# Patient Record
Sex: Male | Born: 1945
Health system: Southern US, Community
[De-identification: ages and names within clinical notes are randomized; demographics above are authoritative.]

## PROBLEM LIST (undated history)

## (undated) DIAGNOSIS — C4491 Basal cell carcinoma of skin, unspecified: Secondary | ICD-10-CM

## (undated) DIAGNOSIS — M354 Diffuse (eosinophilic) fasciitis: Secondary | ICD-10-CM

## (undated) DIAGNOSIS — M8580 Other specified disorders of bone density and structure, unspecified site: Secondary | ICD-10-CM

## (undated) DIAGNOSIS — L940 Localized scleroderma [morphea]: Secondary | ICD-10-CM

## (undated) DIAGNOSIS — D7218 Eosinophilia in diseases classified elsewhere: Secondary | ICD-10-CM

## (undated) DIAGNOSIS — K219 Gastro-esophageal reflux disease without esophagitis: Secondary | ICD-10-CM

## (undated) DIAGNOSIS — F5101 Primary insomnia: Secondary | ICD-10-CM

## (undated) HISTORY — PX: TOOTH EXTRACTION: SUR596

## (undated) HISTORY — DX: Primary insomnia: F51.01

## (undated) HISTORY — PX: COLONOSCOPY: SHX174

## (undated) HISTORY — DX: Eosinophilia in diseases classified elsewhere: D72.18

## (undated) HISTORY — PX: TONSILLECTOMY: SUR1361

## (undated) HISTORY — DX: Gastro-esophageal reflux disease without esophagitis: K21.9

## (undated) HISTORY — DX: Diffuse (eosinophilic) fasciitis: M35.4

## (undated) HISTORY — DX: Basal cell carcinoma of skin, unspecified: C44.91

## (undated) HISTORY — DX: Localized scleroderma (morphea): L94.0

## (undated) HISTORY — DX: Other specified disorders of bone density and structure, unspecified site: M85.80

---

## 2011-01-04 ENCOUNTER — Other Ambulatory Visit: Payer: Self-pay | Admitting: Family Medicine

## 2011-01-04 DIAGNOSIS — R05 Cough: Secondary | ICD-10-CM

## 2011-01-04 DIAGNOSIS — R9389 Abnormal findings on diagnostic imaging of other specified body structures: Secondary | ICD-10-CM

## 2011-01-10 ENCOUNTER — Ambulatory Visit
Admission: RE | Admit: 2011-01-10 | Discharge: 2011-01-10 | Disposition: A | Discharge status: Home / Self Care | Payer: 59 | Source: Ambulatory Visit | Attending: Family Medicine | Admitting: Family Medicine

## 2011-01-10 DIAGNOSIS — R05 Cough: Secondary | ICD-10-CM

## 2011-01-10 DIAGNOSIS — R9389 Abnormal findings on diagnostic imaging of other specified body structures: Secondary | ICD-10-CM

## 2013-11-18 ENCOUNTER — Other Ambulatory Visit: Payer: Self-pay | Admitting: Family Medicine

## 2013-11-18 DIAGNOSIS — R2242 Localized swelling, mass and lump, left lower limb: Secondary | ICD-10-CM

## 2013-11-18 DIAGNOSIS — Z87891 Personal history of nicotine dependence: Secondary | ICD-10-CM

## 2013-11-24 ENCOUNTER — Ambulatory Visit
Admission: RE | Admit: 2013-11-24 | Discharge: 2013-11-24 | Disposition: A | Discharge status: Home / Self Care | Payer: Commercial Managed Care - HMO | Source: Ambulatory Visit | Attending: Family Medicine | Admitting: Family Medicine

## 2013-11-24 ENCOUNTER — Other Ambulatory Visit: Payer: 59

## 2013-11-24 DIAGNOSIS — Z87891 Personal history of nicotine dependence: Secondary | ICD-10-CM

## 2013-11-24 DIAGNOSIS — R2242 Localized swelling, mass and lump, left lower limb: Secondary | ICD-10-CM

## 2014-03-22 DIAGNOSIS — B349 Viral infection, unspecified: Secondary | ICD-10-CM | POA: Diagnosis not present

## 2014-06-16 DIAGNOSIS — H5210 Myopia, unspecified eye: Secondary | ICD-10-CM | POA: Diagnosis not present

## 2014-07-29 DIAGNOSIS — H906 Mixed conductive and sensorineural hearing loss, bilateral: Secondary | ICD-10-CM | POA: Diagnosis not present

## 2014-07-29 DIAGNOSIS — H6123 Impacted cerumen, bilateral: Secondary | ICD-10-CM | POA: Diagnosis not present

## 2014-10-01 DIAGNOSIS — R609 Edema, unspecified: Secondary | ICD-10-CM | POA: Diagnosis not present

## 2014-10-01 DIAGNOSIS — R21 Rash and other nonspecific skin eruption: Secondary | ICD-10-CM | POA: Diagnosis not present

## 2014-10-01 DIAGNOSIS — R0989 Other specified symptoms and signs involving the circulatory and respiratory systems: Secondary | ICD-10-CM | POA: Diagnosis not present

## 2014-10-05 DIAGNOSIS — E538 Deficiency of other specified B group vitamins: Secondary | ICD-10-CM | POA: Diagnosis not present

## 2014-10-05 DIAGNOSIS — D649 Anemia, unspecified: Secondary | ICD-10-CM | POA: Diagnosis not present

## 2014-10-13 DIAGNOSIS — D649 Anemia, unspecified: Secondary | ICD-10-CM | POA: Diagnosis not present

## 2014-10-22 ENCOUNTER — Other Ambulatory Visit: Payer: Self-pay | Admitting: Family Medicine

## 2014-10-22 ENCOUNTER — Ambulatory Visit
Admission: RE | Admit: 2014-10-22 | Discharge: 2014-10-22 | Disposition: A | Discharge status: Home / Self Care | Payer: Commercial Managed Care - HMO | Source: Ambulatory Visit | Attending: Family Medicine | Admitting: Family Medicine

## 2014-10-22 DIAGNOSIS — R635 Abnormal weight gain: Secondary | ICD-10-CM | POA: Diagnosis not present

## 2014-10-22 DIAGNOSIS — R600 Localized edema: Secondary | ICD-10-CM

## 2014-10-22 MED ORDER — IOPAMIDOL (ISOVUE-300) INJECTION 61%: 100.0000 mL | Freq: Once | INTRAVENOUS | Status: DC | PRN

## 2014-10-29 DIAGNOSIS — R609 Edema, unspecified: Secondary | ICD-10-CM | POA: Diagnosis not present

## 2014-11-15 ENCOUNTER — Other Ambulatory Visit: Payer: Self-pay | Admitting: Family Medicine

## 2014-11-15 DIAGNOSIS — R609 Edema, unspecified: Secondary | ICD-10-CM

## 2014-11-18 ENCOUNTER — Inpatient Hospital Stay: Admission: RE | Admit: 2014-11-18 | Payer: Commercial Managed Care - HMO | Source: Ambulatory Visit

## 2014-11-19 ENCOUNTER — Emergency Department (HOSPITAL_BASED_OUTPATIENT_CLINIC_OR_DEPARTMENT_OTHER): Payer: Commercial Managed Care - HMO

## 2014-11-19 ENCOUNTER — Encounter (HOSPITAL_BASED_OUTPATIENT_CLINIC_OR_DEPARTMENT_OTHER): Payer: Self-pay | Admitting: *Deleted

## 2014-11-19 ENCOUNTER — Emergency Department (HOSPITAL_BASED_OUTPATIENT_CLINIC_OR_DEPARTMENT_OTHER)
Admission: EM | Admit: 2014-11-19 | Discharge: 2014-11-19 | Disposition: A | Discharge status: Home / Self Care | Payer: Commercial Managed Care - HMO | Attending: Emergency Medicine | Admitting: Emergency Medicine

## 2014-11-19 DIAGNOSIS — R609 Edema, unspecified: Secondary | ICD-10-CM | POA: Diagnosis not present

## 2014-11-19 DIAGNOSIS — Z23 Encounter for immunization: Secondary | ICD-10-CM | POA: Diagnosis not present

## 2014-11-19 DIAGNOSIS — D649 Anemia, unspecified: Secondary | ICD-10-CM | POA: Diagnosis not present

## 2014-11-19 DIAGNOSIS — R600 Localized edema: Secondary | ICD-10-CM | POA: Diagnosis not present

## 2014-11-19 DIAGNOSIS — Z0001 Encounter for general adult medical examination with abnormal findings: Secondary | ICD-10-CM | POA: Diagnosis not present

## 2014-11-19 DIAGNOSIS — R7989 Other specified abnormal findings of blood chemistry: Secondary | ICD-10-CM | POA: Diagnosis not present

## 2014-11-19 DIAGNOSIS — E538 Deficiency of other specified B group vitamins: Secondary | ICD-10-CM | POA: Diagnosis not present

## 2014-11-19 DIAGNOSIS — Z7189 Other specified counseling: Secondary | ICD-10-CM | POA: Diagnosis not present

## 2014-11-19 LAB — BASIC METABOLIC PANEL
Anion gap: 6 (ref 5–15)
BUN: 14 mg/dL (ref 6–20)
CHLORIDE: 101 mmol/L (ref 101–111)
CO2: 29 mmol/L (ref 22–32)
CREATININE: 0.95 mg/dL (ref 0.61–1.24)
Calcium: 8.6 mg/dL — ABNORMAL LOW (ref 8.9–10.3)
GFR calc non Af Amer: >60 mL/min (ref >60)
Glucose, Bld: 104 mg/dL — ABNORMAL HIGH (ref 65–99)
Potassium: 3.8 mmol/L (ref 3.5–5.1)
SODIUM: 136 mmol/L (ref 135–145)

## 2014-11-19 LAB — CBC WITH DIFFERENTIAL/PLATELET
BASOS ABS: 0 10*3/uL (ref 0.0–0.1)
Basophils Relative: 0 %
Eosinophils Absolute: 1.9 10*3/uL — ABNORMAL HIGH (ref 0.0–0.7)
Eosinophils Relative: 22 %
HCT: 30.1 % — ABNORMAL LOW (ref 39.0–52.0)
HEMOGLOBIN: 10.2 g/dL — AB (ref 13.0–17.0)
Lymphocytes Relative: 7 %
Lymphs Abs: 0.6 10*3/uL — ABNORMAL LOW (ref 0.7–4.0)
MCH: 30.6 pg (ref 26.0–34.0)
MCHC: 33.9 g/dL (ref 30.0–36.0)
MCV: 90.4 fL (ref 78.0–100.0)
MONOS PCT: 5 %
Monocytes Absolute: 0.4 10*3/uL (ref 0.1–1.0)
NEUTROS PCT: 66 %
Neutro Abs: 5.9 10*3/uL (ref 1.7–7.7)
Platelets: 325 10*3/uL (ref 150–400)
RBC: 3.33 MIL/uL — AB (ref 4.22–5.81)
RDW: 13.3 % (ref 11.5–15.5)
WBC: 8.8 10*3/uL (ref 4.0–10.5)

## 2014-11-19 MED ORDER — IOHEXOL 350 MG/ML SOLN
100.0000 mL | Freq: Once | INTRAVENOUS | Status: AC | PRN
  Administered 2014-11-19: 100 mL via INTRAVENOUS

## 2014-11-19 NOTE — Discharge Instructions (Signed)
Edema °Edema is an abnormal buildup of fluids in your body tissues. Edema is somewhat dependent on gravity to pull the fluid to the lowest place in your body. That makes the condition more common in the legs and thighs (lower extremities). Painless swelling of the feet and ankles is common and becomes more likely as you get older. It is also common in looser tissues, like around your eyes.  °When the affected area is squeezed, the fluid may move out of that spot and leave a dent for a few moments. This dent is called pitting.  °CAUSES  °There are many possible causes of edema. Eating too much salt and being on your feet or sitting for a long time can cause edema in your legs and ankles. Hot weather may make edema worse. Common medical causes of edema include: °· Heart failure. °· Liver disease. °· Kidney disease. °· Weak blood vessels in your legs. °· Cancer. °· An injury. °· Pregnancy. °· Some medications. °· Obesity.  °SYMPTOMS  °Edema is usually painless. Your skin may look swollen or shiny.  °DIAGNOSIS  °Your health care provider may be able to diagnose edema by asking about your medical history and doing a physical exam. You may need to have tests such as X-rays, an electrocardiogram, or blood tests to check for medical conditions that may cause edema.  °TREATMENT  °Edema treatment depends on the cause. If you have heart, liver, or kidney disease, you need the treatment appropriate for these conditions. General treatment may include: °· Elevation of the affected body part above the level of your heart. °· Compression of the affected body part. Pressure from elastic bandages or support stockings squeezes the tissues and forces fluid back into the blood vessels. This keeps fluid from entering the tissues. °· Restriction of fluid and salt intake. °· Use of a water pill (diuretic). These medications are appropriate only for some types of edema. They pull fluid out of your body and make you urinate more often. This  gets rid of fluid and reduces swelling, but diuretics can have side effects. Only use diuretics as directed by your health care provider. °HOME CARE INSTRUCTIONS  °· Keep the affected body part above the level of your heart when you are lying down.   °· Do not sit still or stand for prolonged periods.   °· Do not put anything directly under your knees when lying down. °· Do not wear constricting clothing or garters on your upper legs.   °· Exercise your legs to work the fluid back into your blood vessels. This may help the swelling go down.   °· Wear elastic bandages or support stockings to reduce ankle swelling as directed by your health care provider.   °· Eat a low-salt diet to reduce fluid if your health care provider recommends it.   °· Only take medicines as directed by your health care provider.  °SEEK MEDICAL CARE IF:  °· Your edema is not responding to treatment. °· You have heart, liver, or kidney disease and notice symptoms of edema. °· You have edema in your legs that does not improve after elevating them.   °· You have sudden and unexplained weight gain. °SEEK IMMEDIATE MEDICAL CARE IF:  °· You develop shortness of breath or chest pain.   °· You cannot breathe when you lie down. °· You develop pain, redness, or warmth in the swollen areas.   °· You have heart, liver, or kidney disease and suddenly get edema. °· You have a fever and your symptoms suddenly get worse. °MAKE SURE YOU:  °·   Understand these instructions. °· Will watch your condition. °· Will get help right away if you are not doing well or get worse. °  °This information is not intended to replace advice given to you by your health care provider. Make sure you discuss any questions you have with your health care provider. °  °Document Released: 01/01/2005 Document Revised: 01/22/2014 Document Reviewed: 10/24/2012 °Elsevier Interactive Patient Education ©2016 [employer] ° °

## 2014-11-19 NOTE — ED Notes (Signed)
Patient denies any SOB, or Chest pain. Gross pitting edema noted to his bilateral lower extremities and upper extremities from his elbow down. Skin is tight and reddish in color with dry flaky skin noted to his lower extremities. Patient ambulatory without any distress. No Respiratory distress noted with exertion, per the patient. No observed.

## 2014-11-19 NOTE — ED Notes (Signed)
Patient transported to CT 

## 2014-11-19 NOTE — ED Provider Notes (Signed)
CSN: [id number]     Arrival date & time 11/19/14  1629 History   First MD Initiated Contact with Patient 11/19/14 1653     Chief Complaint  Patient presents with  . Leg Swelling  . Abnormal Lab    HPI Pt has been having trouble with swelling in his extremities primarily in his arms for a few months.  He also has noticed a 5 lb weight gain.  His arms have felt swollen and tight.  It then started involving his legs.  His skin has been scaly.  He has been seeing his primary doctor for this.  He has had extensive testing.  He had  CT scan of his abdomen.  He has had lab testing as well.  His doctor recently did a d dimer test and it was elevated.  He was sent to the ED today to have a CT scan of his chest. History reviewed. No pertinent past medical history. Past Surgical History  Procedure Laterality Date  . Tonsillectomy     No family history on file. Social History  Substance Use Topics  . Smoking status: Never Smoker   . Smokeless tobacco: None  . Alcohol Use: Yes     Comment: daily    Review of Systems  Respiratory: Negative for choking, chest tightness and shortness of breath.   Cardiovascular: Negative for chest pain.  All other systems reviewed and are negative.     Allergies  Meloxicam  Home Medications   Prior to Admission medications   Not on File   BP 124/68 mmHg  Pulse 90  Temp(Src) 98.9 F (37.2 C) (Oral)  Resp 20  Ht 5\' 9"  (1.753 m)  Wt 168 lb (76.204 kg)  BMI 24.80 kg/m2  SpO2 100% Physical Exam  Musculoskeletal: He exhibits edema.  Bilateral upper and lower extremities    ED Course  Procedures (including critical care time) Labs Review Labs Reviewed  CBC WITH DIFFERENTIAL/PLATELET - Abnormal; Notable for the following:    RBC 3.33 (*)    Hemoglobin 10.2 (*)    HCT 30.1 (*)    Lymphs Abs 0.6 (*)    Eosinophils Absolute 1.9 (*)    All other components within normal limits  BASIC METABOLIC PANEL - Abnormal; Notable for the following:     Glucose, Bld 104 (*)    Calcium 8.6 (*)    All other components within normal limits    Imaging Review Ct Angio Chest Pe W/cm &/or Wo Cm  11/19/2014  CLINICAL DATA:  69 year old with bilateral upper and lower extremity edema. Elevated D-dimer at his primary care provider's office earlier today. EXAM: CT ANGIOGRAPHY CHEST WITH CONTRAST TECHNIQUE: Multidetector CT imaging of the chest was performed using the standard protocol during bolus administration of intravenous contrast. Multiplanar CT image reconstructions and MIPs were obtained to evaluate the vascular anatomy. CONTRAST:  16mL OMNIPAQUE IOHEXOL 350 MG/ML SOLN COMPARISON:  No prior CTA chest.  Unenhanced CT chest 01/10/2011. FINDINGS: Technical quality:  Good. Pulmonary embolism:  Absent. Cardiovascular: Normal heart size. No pericardial effusion. Minimal LAD coronary atherosclerosis. Mild atherosclerosis involving the thoracic and upper abdominal aorta without aneurysm. Mediastinum/Lymph Nodes: No pathologically enlarged mediastinal, hilar or axillary lymph nodes. No mediastinal masses. Normal-appearing esophagus. Thyroid gland normal in appearance. Lungs/Pleura: Hyperinflation with emphysematous changes throughout both lungs. Biapical pleuroparenchymal scarring, calcified on the left. Minimal linear scarring in the lower lobes. Pulmonary parenchyma clear otherwise without localized airspace consolidation, interstitial disease, or parenchymal nodules or masses. Central airways patent  without significant bronchial wall thickening. No pleural effusions. No pleural plaques or masses. Upper abdomen: Possible splenic enlargement. Visualized upper abdomen otherwise unremarkable for the early portal venous phase of enhancement. 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 Review of the MIP images confirms the above findings. IMPRESSION: 1. No evidence of pulmonary embolism. 2. COPD/emphysema.  No acute  cardiopulmonary disease. 3. Minimal LAD coronary atherosclerosis. 4. Possible splenomegaly. Electronically Signed   By: Evangeline Dakin M.D.   On: 11/19/2014 18:33   US Venous Img Lower Bilateral  11/19/2014  CLINICAL DATA:  Bilateral leg swelling for the past 2 months. Elevated D-dimer. EXAM: BILATERAL LOWER EXTREMITY VENOUS DOPPLER ULTRASOUND TECHNIQUE: Gray-scale sonography with graded compression, as well as color Doppler and duplex ultrasound were performed to evaluate the lower extremity deep venous systems from the level of the common femoral vein and including the common femoral, femoral, profunda femoral, popliteal and calf veins including the posterior tibial, peroneal and gastrocnemius veins when visible. The superficial great saphenous vein was also interrogated. Spectral Doppler was utilized to evaluate flow at rest and with distal augmentation maneuvers in the common femoral, femoral and popliteal veins. COMPARISON:  None. FINDINGS: RIGHT LOWER EXTREMITY Common Femoral Vein: No evidence of thrombus. Normal compressibility, respiratory phasicity and response to augmentation. Saphenofemoral Junction: No evidence of thrombus. Normal compressibility and flow on color Doppler imaging. Profunda Femoral Vein: No evidence of thrombus. Normal compressibility and flow on color Doppler imaging. Femoral Vein: No evidence of thrombus. Normal compressibility, respiratory phasicity and response to augmentation. Popliteal Vein: No evidence of thrombus. Normal compressibility, respiratory phasicity and response to augmentation. Calf Veins: No evidence of thrombus. Normal compressibility and flow on color Doppler imaging. Superficial Great Saphenous Vein: No evidence of thrombus. Normal compressibility and flow on color Doppler imaging. Venous Reflux:  None. Other Findings:  None. LEFT LOWER EXTREMITY Common Femoral Vein: No evidence of thrombus. Normal compressibility, respiratory phasicity and response to  augmentation. Saphenofemoral Junction: No evidence of thrombus. Normal compressibility and flow on color Doppler imaging. Profunda Femoral Vein: No evidence of thrombus. Normal compressibility and flow on color Doppler imaging. Femoral Vein: No evidence of thrombus. Normal compressibility, respiratory phasicity and response to augmentation. Popliteal Vein: No evidence of thrombus. Normal compressibility, respiratory phasicity and response to augmentation. Calf Veins: No evidence of thrombus. Normal compressibility and flow on color Doppler imaging. Superficial Great Saphenous Vein: No evidence of thrombus. Normal compressibility and flow on color Doppler imaging. Venous Reflux:  None. Other Findings: Bilateral diffuse subcutaneous edema. There is also a left popliteal cyst measuring 5.1 x 4.4 x 1.8 cm. Pulsatile deep venous waveforms bilaterally. IMPRESSION: 1. No deep venous thrombosis seen. 2. 5.1 cm left popliteal cyst. 3. Diffuse subcutaneous edema. 4. Pulsatile deep venous waveforms bilaterally, compatible with right heart failure. Electronically Signed   By: Claudie Revering M.D.   On: 11/19/2014 18:46   I have personally reviewed and evaluated these images and lab results as part of my medical decision-making.  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 Patient has no evidence of pulmonary embolism or venous thrombosis. He does have a mild anemia but this is not new.  Doppler study didn't suggest the possibility of right sided heart failure. This could account for the peripheral edema. I reviewed the office notes from the  patient's primary care doctor. Elpidio Eric for an outpatient echocardiogram and cardiology consultation. Patient is not having any chest pain or dyspnea. Do think he is stable to continue this outpatient  evaluation.    Dorie Rank, MD 11/19/14 2020

## 2014-11-19 NOTE — ED Notes (Signed)
He has had swelling in his extremities for a couple of weeks. He went for a physical this am and had an elevated Ddimer. Denies sob or pain.

## 2014-11-29 ENCOUNTER — Other Ambulatory Visit: Payer: Self-pay | Admitting: Cardiology

## 2014-11-29 ENCOUNTER — Ambulatory Visit
Admission: RE | Admit: 2014-11-29 | Discharge: 2014-11-29 | Disposition: A | Discharge status: Home / Self Care | Payer: Commercial Managed Care - HMO | Source: Ambulatory Visit | Attending: Cardiology | Admitting: Cardiology

## 2014-11-29 DIAGNOSIS — R0602 Shortness of breath: Secondary | ICD-10-CM | POA: Diagnosis not present

## 2014-11-29 DIAGNOSIS — R609 Edema, unspecified: Secondary | ICD-10-CM | POA: Diagnosis not present

## 2014-11-29 DIAGNOSIS — D649 Anemia, unspecified: Secondary | ICD-10-CM | POA: Diagnosis not present

## 2014-11-29 DIAGNOSIS — D508 Other iron deficiency anemias: Secondary | ICD-10-CM | POA: Diagnosis not present

## 2014-11-29 DIAGNOSIS — R600 Localized edema: Secondary | ICD-10-CM | POA: Diagnosis not present

## 2014-12-21 DIAGNOSIS — L940 Localized scleroderma [morphea]: Secondary | ICD-10-CM | POA: Diagnosis not present

## 2014-12-21 DIAGNOSIS — L986 Other infiltrative disorders of the skin and subcutaneous tissue: Secondary | ICD-10-CM | POA: Diagnosis not present

## 2014-12-21 DIAGNOSIS — Z79899 Other long term (current) drug therapy: Secondary | ICD-10-CM | POA: Diagnosis not present

## 2014-12-21 DIAGNOSIS — M349 Systemic sclerosis, unspecified: Secondary | ICD-10-CM | POA: Diagnosis not present

## 2014-12-24 DIAGNOSIS — L986 Other infiltrative disorders of the skin and subcutaneous tissue: Secondary | ICD-10-CM | POA: Diagnosis not present

## 2014-12-24 DIAGNOSIS — L940 Localized scleroderma [morphea]: Secondary | ICD-10-CM | POA: Diagnosis not present

## 2014-12-24 DIAGNOSIS — Z79899 Other long term (current) drug therapy: Secondary | ICD-10-CM | POA: Diagnosis not present

## 2014-12-24 DIAGNOSIS — M349 Systemic sclerosis, unspecified: Secondary | ICD-10-CM | POA: Diagnosis not present

## 2014-12-28 DIAGNOSIS — R5383 Other fatigue: Secondary | ICD-10-CM | POA: Diagnosis not present

## 2014-12-28 DIAGNOSIS — R21 Rash and other nonspecific skin eruption: Secondary | ICD-10-CM | POA: Diagnosis not present

## 2014-12-28 DIAGNOSIS — L940 Localized scleroderma [morphea]: Secondary | ICD-10-CM | POA: Diagnosis not present

## 2014-12-28 DIAGNOSIS — M354 Diffuse (eosinophilic) fasciitis: Secondary | ICD-10-CM | POA: Diagnosis not present

## 2015-01-03 DIAGNOSIS — M354 Diffuse (eosinophilic) fasciitis: Secondary | ICD-10-CM | POA: Diagnosis not present

## 2015-01-12 ENCOUNTER — Other Ambulatory Visit: Payer: Self-pay | Admitting: Dermatology

## 2015-01-18 DIAGNOSIS — M354 Diffuse (eosinophilic) fasciitis: Secondary | ICD-10-CM | POA: Diagnosis not present

## 2015-01-18 DIAGNOSIS — F5101 Primary insomnia: Secondary | ICD-10-CM | POA: Diagnosis not present

## 2015-01-18 DIAGNOSIS — Z79899 Other long term (current) drug therapy: Secondary | ICD-10-CM | POA: Diagnosis not present

## 2015-01-19 ENCOUNTER — Encounter: Payer: Self-pay | Admitting: Cardiology

## 2015-01-19 DIAGNOSIS — M354 Diffuse (eosinophilic) fasciitis: Principal | ICD-10-CM

## 2015-01-25 DIAGNOSIS — Z79899 Other long term (current) drug therapy: Secondary | ICD-10-CM | POA: Diagnosis not present

## 2015-01-25 DIAGNOSIS — M354 Diffuse (eosinophilic) fasciitis: Secondary | ICD-10-CM | POA: Diagnosis not present

## 2015-02-09 DIAGNOSIS — M354 Diffuse (eosinophilic) fasciitis: Secondary | ICD-10-CM | POA: Diagnosis not present

## 2015-02-14 DIAGNOSIS — L2084 Intrinsic (allergic) eczema: Secondary | ICD-10-CM | POA: Diagnosis not present

## 2015-02-14 DIAGNOSIS — Z5181 Encounter for therapeutic drug level monitoring: Secondary | ICD-10-CM | POA: Diagnosis not present

## 2015-02-14 DIAGNOSIS — M354 Diffuse (eosinophilic) fasciitis: Secondary | ICD-10-CM | POA: Diagnosis not present

## 2015-02-25 DIAGNOSIS — G4700 Insomnia, unspecified: Secondary | ICD-10-CM | POA: Diagnosis not present

## 2015-02-25 DIAGNOSIS — R21 Rash and other nonspecific skin eruption: Secondary | ICD-10-CM | POA: Diagnosis not present

## 2015-02-25 DIAGNOSIS — Z6821 Body mass index (BMI) 21.0-21.9, adult: Secondary | ICD-10-CM | POA: Diagnosis not present

## 2015-04-12 DIAGNOSIS — Z5181 Encounter for therapeutic drug level monitoring: Secondary | ICD-10-CM | POA: Diagnosis not present

## 2015-04-12 DIAGNOSIS — L309 Dermatitis, unspecified: Secondary | ICD-10-CM | POA: Diagnosis not present

## 2015-04-12 DIAGNOSIS — M354 Diffuse (eosinophilic) fasciitis: Secondary | ICD-10-CM | POA: Diagnosis not present

## 2015-05-25 DIAGNOSIS — Z7952 Long term (current) use of systemic steroids: Secondary | ICD-10-CM | POA: Diagnosis not present

## 2015-05-25 DIAGNOSIS — Z79899 Other long term (current) drug therapy: Secondary | ICD-10-CM | POA: Diagnosis not present

## 2015-05-25 DIAGNOSIS — L97909 Non-pressure chronic ulcer of unspecified part of unspecified lower leg with unspecified severity: Secondary | ICD-10-CM | POA: Diagnosis not present

## 2015-05-25 DIAGNOSIS — M354 Diffuse (eosinophilic) fasciitis: Secondary | ICD-10-CM | POA: Diagnosis not present

## 2015-06-07 DIAGNOSIS — M354 Diffuse (eosinophilic) fasciitis: Secondary | ICD-10-CM | POA: Diagnosis not present

## 2015-06-14 DIAGNOSIS — M354 Diffuse (eosinophilic) fasciitis: Secondary | ICD-10-CM | POA: Diagnosis not present

## 2015-08-03 DIAGNOSIS — I890 Lymphedema, not elsewhere classified: Secondary | ICD-10-CM | POA: Diagnosis not present

## 2015-08-03 DIAGNOSIS — Z79899 Other long term (current) drug therapy: Secondary | ICD-10-CM | POA: Diagnosis not present

## 2015-08-03 DIAGNOSIS — L97922 Non-pressure chronic ulcer of unspecified part of left lower leg with fat layer exposed: Secondary | ICD-10-CM | POA: Diagnosis not present

## 2015-08-03 DIAGNOSIS — M354 Diffuse (eosinophilic) fasciitis: Secondary | ICD-10-CM | POA: Diagnosis not present

## 2015-08-03 DIAGNOSIS — L97912 Non-pressure chronic ulcer of unspecified part of right lower leg with fat layer exposed: Secondary | ICD-10-CM | POA: Diagnosis not present

## 2015-11-01 DIAGNOSIS — Z23 Encounter for immunization: Secondary | ICD-10-CM | POA: Diagnosis not present

## 2015-11-07 DIAGNOSIS — M354 Diffuse (eosinophilic) fasciitis: Secondary | ICD-10-CM | POA: Diagnosis not present

## 2015-11-07 DIAGNOSIS — Z7952 Long term (current) use of systemic steroids: Secondary | ICD-10-CM | POA: Diagnosis not present

## 2015-11-07 DIAGNOSIS — Z79899 Other long term (current) drug therapy: Secondary | ICD-10-CM | POA: Diagnosis not present

## 2015-11-12 DIAGNOSIS — L0291 Cutaneous abscess, unspecified: Secondary | ICD-10-CM | POA: Diagnosis not present

## 2015-11-12 DIAGNOSIS — L02511 Cutaneous abscess of right hand: Secondary | ICD-10-CM | POA: Diagnosis not present

## 2015-11-17 DIAGNOSIS — L03019 Cellulitis of unspecified finger: Secondary | ICD-10-CM | POA: Diagnosis not present

## 2015-12-06 DIAGNOSIS — F5101 Primary insomnia: Secondary | ICD-10-CM | POA: Diagnosis not present

## 2015-12-06 DIAGNOSIS — Z0000 Encounter for general adult medical examination without abnormal findings: Secondary | ICD-10-CM | POA: Diagnosis not present

## 2015-12-13 DIAGNOSIS — R5383 Other fatigue: Secondary | ICD-10-CM | POA: Diagnosis not present

## 2015-12-13 DIAGNOSIS — M354 Diffuse (eosinophilic) fasciitis: Secondary | ICD-10-CM | POA: Diagnosis not present

## 2015-12-13 DIAGNOSIS — R21 Rash and other nonspecific skin eruption: Secondary | ICD-10-CM | POA: Diagnosis not present

## 2016-03-14 DIAGNOSIS — L97912 Non-pressure chronic ulcer of unspecified part of right lower leg with fat layer exposed: Secondary | ICD-10-CM | POA: Diagnosis not present

## 2016-03-14 DIAGNOSIS — M354 Diffuse (eosinophilic) fasciitis: Secondary | ICD-10-CM | POA: Diagnosis not present

## 2016-03-14 DIAGNOSIS — Z79899 Other long term (current) drug therapy: Secondary | ICD-10-CM | POA: Diagnosis not present

## 2016-03-15 DIAGNOSIS — L97912 Non-pressure chronic ulcer of unspecified part of right lower leg with fat layer exposed: Secondary | ICD-10-CM | POA: Insufficient documentation

## 2016-04-24 DIAGNOSIS — X32XXXD Exposure to sunlight, subsequent encounter: Secondary | ICD-10-CM | POA: Diagnosis not present

## 2016-04-24 DIAGNOSIS — L821 Other seborrheic keratosis: Secondary | ICD-10-CM | POA: Diagnosis not present

## 2016-04-24 DIAGNOSIS — M354 Diffuse (eosinophilic) fasciitis: Secondary | ICD-10-CM | POA: Diagnosis not present

## 2016-04-24 DIAGNOSIS — L570 Actinic keratosis: Secondary | ICD-10-CM | POA: Diagnosis not present

## 2016-07-25 DIAGNOSIS — L97912 Non-pressure chronic ulcer of unspecified part of right lower leg with fat layer exposed: Secondary | ICD-10-CM | POA: Diagnosis not present

## 2016-07-25 DIAGNOSIS — Z872 Personal history of diseases of the skin and subcutaneous tissue: Secondary | ICD-10-CM | POA: Diagnosis not present

## 2016-07-25 DIAGNOSIS — Z7952 Long term (current) use of systemic steroids: Secondary | ICD-10-CM | POA: Diagnosis not present

## 2016-07-25 DIAGNOSIS — M354 Diffuse (eosinophilic) fasciitis: Secondary | ICD-10-CM | POA: Diagnosis not present

## 2016-07-25 DIAGNOSIS — Z79899 Other long term (current) drug therapy: Secondary | ICD-10-CM | POA: Diagnosis not present

## 2016-12-05 DIAGNOSIS — Z79899 Other long term (current) drug therapy: Secondary | ICD-10-CM | POA: Diagnosis not present

## 2016-12-05 DIAGNOSIS — M354 Diffuse (eosinophilic) fasciitis: Secondary | ICD-10-CM | POA: Diagnosis not present

## 2016-12-05 DIAGNOSIS — Z5181 Encounter for therapeutic drug level monitoring: Secondary | ICD-10-CM | POA: Diagnosis not present

## 2016-12-11 DIAGNOSIS — R5383 Other fatigue: Secondary | ICD-10-CM | POA: Diagnosis not present

## 2016-12-11 DIAGNOSIS — Z131 Encounter for screening for diabetes mellitus: Secondary | ICD-10-CM | POA: Diagnosis not present

## 2016-12-11 DIAGNOSIS — M354 Diffuse (eosinophilic) fasciitis: Secondary | ICD-10-CM | POA: Diagnosis not present

## 2016-12-11 DIAGNOSIS — Z13220 Encounter for screening for lipoid disorders: Secondary | ICD-10-CM | POA: Diagnosis not present

## 2016-12-11 DIAGNOSIS — Z125 Encounter for screening for malignant neoplasm of prostate: Secondary | ICD-10-CM | POA: Diagnosis not present

## 2016-12-18 DIAGNOSIS — Z0000 Encounter for general adult medical examination without abnormal findings: Secondary | ICD-10-CM | POA: Diagnosis not present

## 2016-12-18 DIAGNOSIS — M354 Diffuse (eosinophilic) fasciitis: Secondary | ICD-10-CM | POA: Diagnosis not present

## 2016-12-18 DIAGNOSIS — Z125 Encounter for screening for malignant neoplasm of prostate: Secondary | ICD-10-CM | POA: Diagnosis not present

## 2016-12-18 DIAGNOSIS — R21 Rash and other nonspecific skin eruption: Secondary | ICD-10-CM | POA: Diagnosis not present

## 2016-12-18 DIAGNOSIS — R5383 Other fatigue: Secondary | ICD-10-CM | POA: Diagnosis not present

## 2016-12-21 IMAGING — CT CT ANGIO CHEST
2 of 6 series · 18 of 36 positions shown · IV contrast (APPLIED)
Comparison: No prior CTA chest.  Unenhanced CT chest 01/10/2011.

CLINICAL DATA: 68-year-old with bilateral upper and lower extremity
edema. Elevated D-dimer at his primary care provider's office
earlier today.

EXAM:
CT ANGIOGRAPHY CHEST WITH CONTRAST
TECHNIQUE: Multidetector CT imaging of the chest was performed using the
standard protocol during bolus administration of intravenous
contrast. Multiplanar CT image reconstructions and MIPs were
obtained to evaluate the vascular anatomy.
CONTRAST:  100mL OMNIPAQUE IOHEXOL 350 MG/ML SOLN

[Series 5: pe 1.0 b26f · axial · 0.71mm/px · z∈[-354,-45]mm · 17 of 345 slices shown]
[im 18/345  lung]
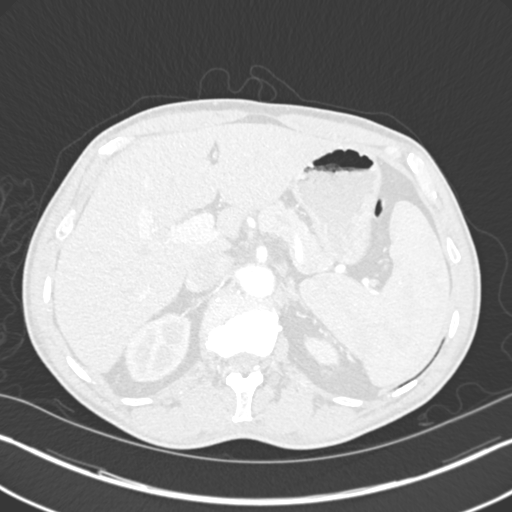
[im 35/345  mediastinal]
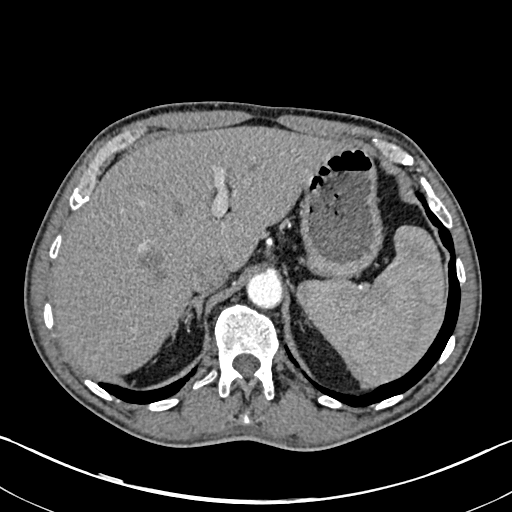
[im 52/345  lung]
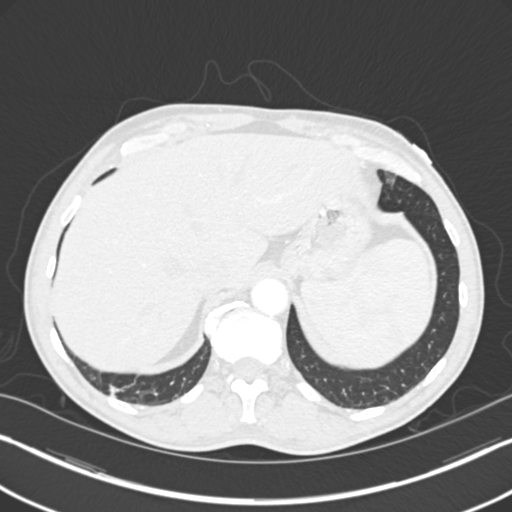
[im 69/345  mediastinal]
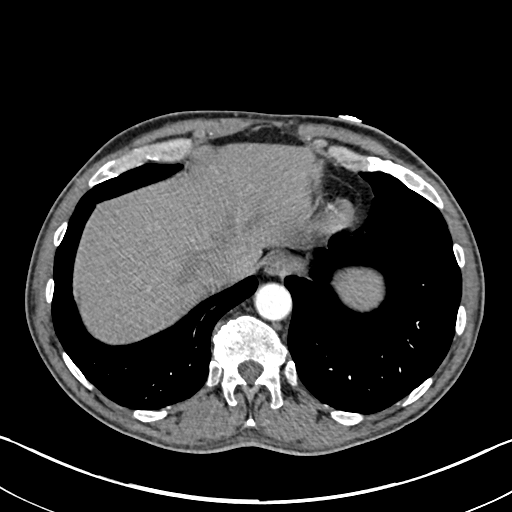
[im 104/345  lung]
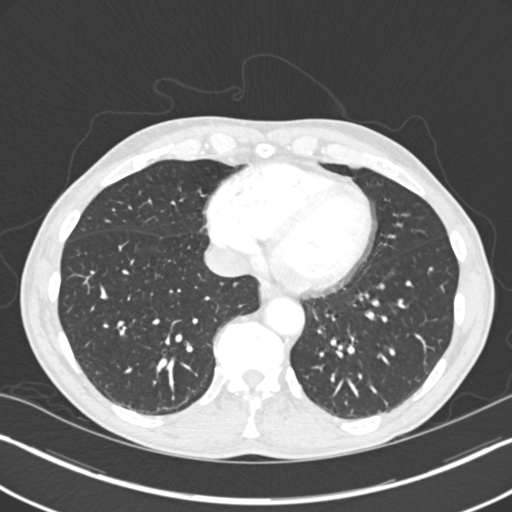
[im 121/345  mediastinal]
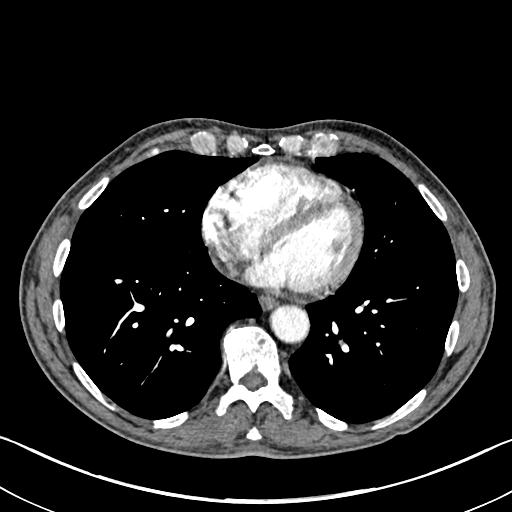
[im 138/345  lung]
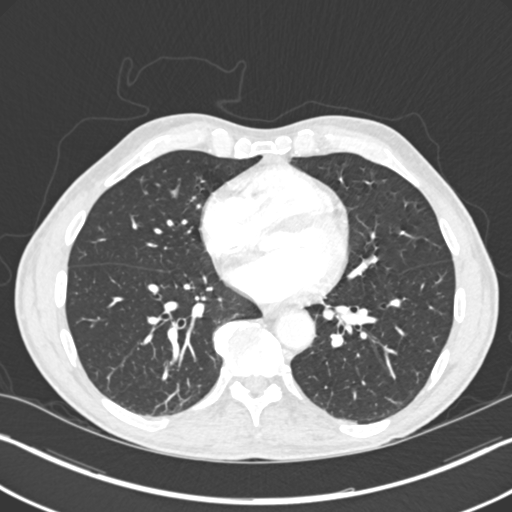
[im 155/345  mediastinal]
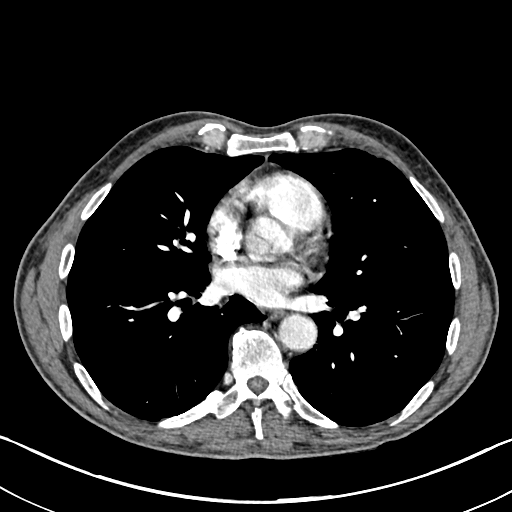
[im 173/345  lung]
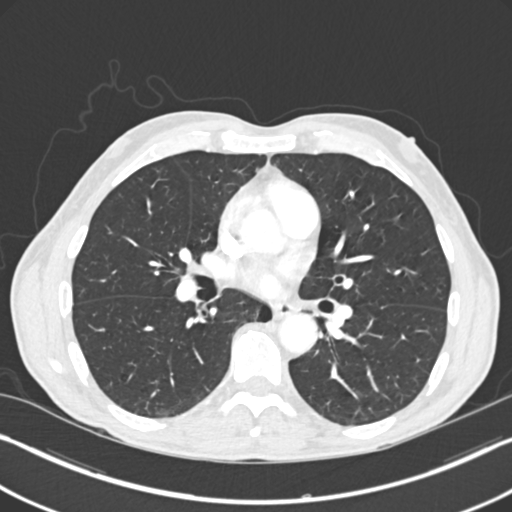
[im 190/345  mediastinal]
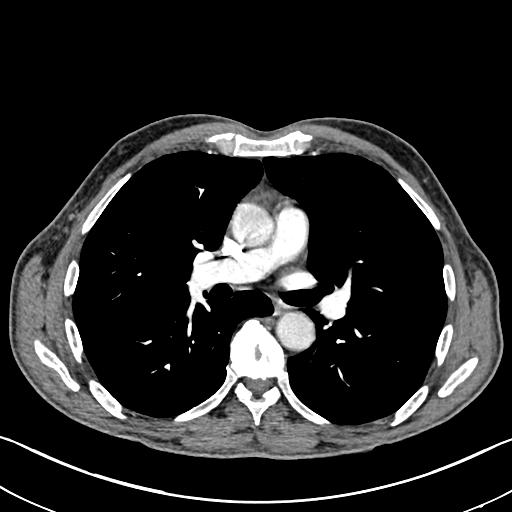
[im 207/345  lung]
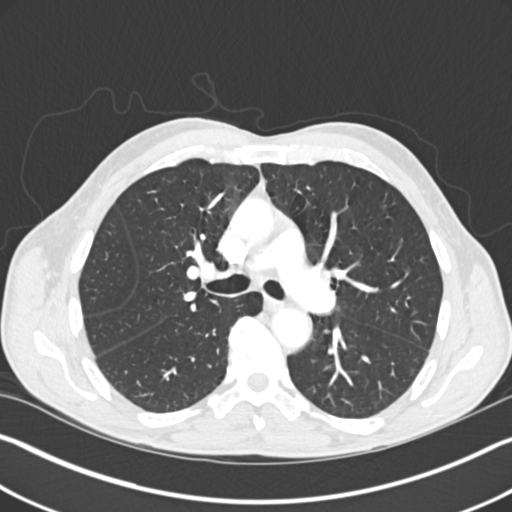
[im 224/345  mediastinal]
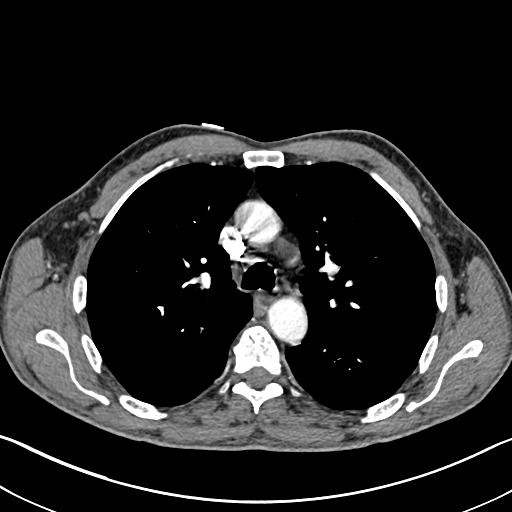
[im 241/345  lung]
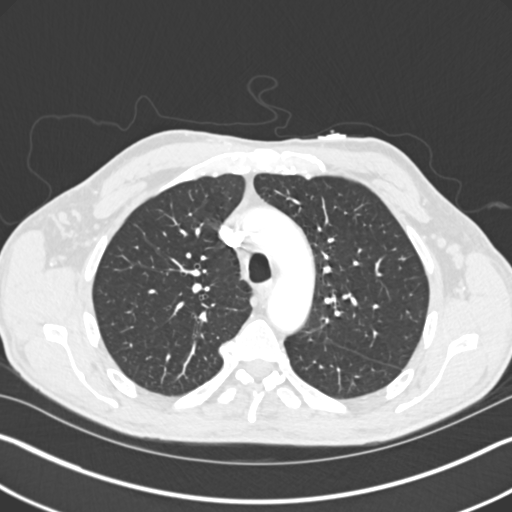
[im 276/345  mediastinal]
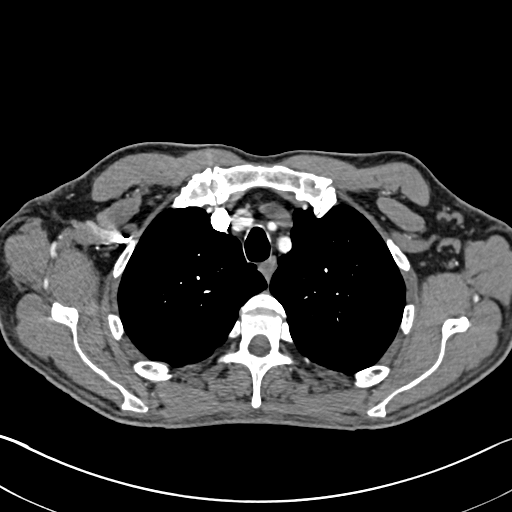
[im 293/345  lung]
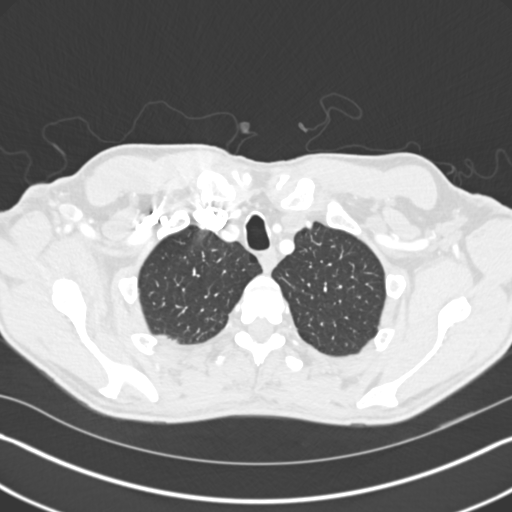
[im 310/345  mediastinal]
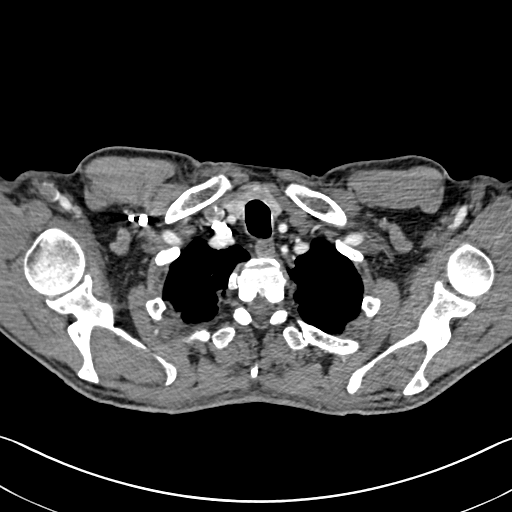
[im 327/345  lung]
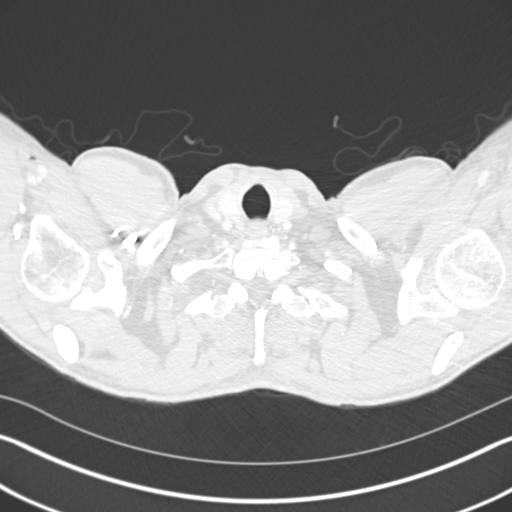

[Series 8: pe 2.0 coronal · coronal · 0.71mm/px · 1 of 120 slices shown]
[im 60/120  mediastinal]
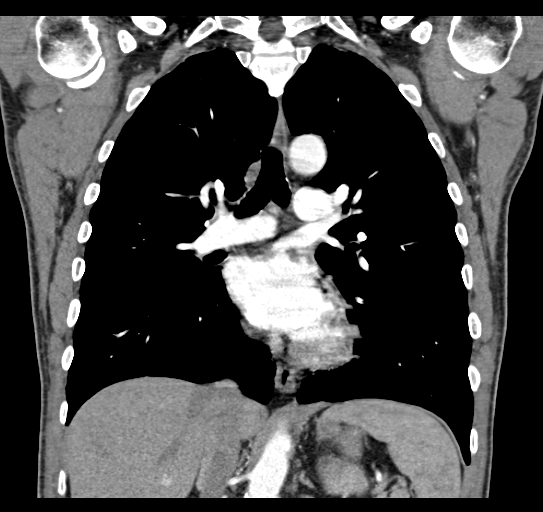

[18 of 36 positions shown; findings below may reference images not displayed]

FINDINGS: Technical quality:  Good.

Pulmonary embolism:  Absent.

Cardiovascular: Normal heart size. No pericardial effusion. Minimal
LAD coronary atherosclerosis. Mild atherosclerosis involving the
thoracic and upper abdominal aorta without aneurysm.

Mediastinum/Lymph Nodes: No pathologically enlarged mediastinal,
hilar or axillary lymph nodes. No mediastinal masses.
Normal-appearing esophagus. Thyroid gland normal in appearance.

Lungs/Pleura: Hyperinflation with emphysematous changes throughout
both lungs. Biapical pleuroparenchymal scarring, calcified on the
left. Minimal linear scarring in the lower lobes. Pulmonary
parenchyma clear otherwise without localized airspace consolidation,
interstitial disease, or parenchymal nodules or masses. Central
airways patent without significant bronchial wall thickening. No
pleural effusions. No pleural plaques or masses.

Upper abdomen: Possible splenic enlargement. Visualized upper
abdomen otherwise unremarkable for the early portal venous phase of
enhancement.

Musculoskeletal: Degenerative disc disease, spondylosis and DISH
involving the thoracic spine.

Review of the MIP images confirms the above findings.
IMPRESSION: 1. No evidence of pulmonary embolism.
2. COPD/emphysema.  No acute cardiopulmonary disease.
3. Minimal LAD coronary atherosclerosis.
4. Possible splenomegaly.

## 2017-04-10 DIAGNOSIS — Z79899 Other long term (current) drug therapy: Secondary | ICD-10-CM | POA: Diagnosis not present

## 2017-04-10 DIAGNOSIS — M354 Diffuse (eosinophilic) fasciitis: Secondary | ICD-10-CM | POA: Diagnosis not present

## 2017-06-18 DIAGNOSIS — H25013 Cortical age-related cataract, bilateral: Secondary | ICD-10-CM | POA: Diagnosis not present

## 2017-06-18 DIAGNOSIS — H2513 Age-related nuclear cataract, bilateral: Secondary | ICD-10-CM | POA: Diagnosis not present

## 2017-06-18 DIAGNOSIS — H524 Presbyopia: Secondary | ICD-10-CM | POA: Diagnosis not present

## 2017-06-18 DIAGNOSIS — H35363 Drusen (degenerative) of macula, bilateral: Secondary | ICD-10-CM | POA: Diagnosis not present

## 2017-07-23 DIAGNOSIS — Z7952 Long term (current) use of systemic steroids: Secondary | ICD-10-CM | POA: Diagnosis not present

## 2017-07-23 DIAGNOSIS — M354 Diffuse (eosinophilic) fasciitis: Secondary | ICD-10-CM | POA: Diagnosis not present

## 2017-07-23 DIAGNOSIS — Z79899 Other long term (current) drug therapy: Secondary | ICD-10-CM | POA: Diagnosis not present

## 2017-07-23 DIAGNOSIS — Z5181 Encounter for therapeutic drug level monitoring: Secondary | ICD-10-CM | POA: Diagnosis not present

## 2017-10-21 DIAGNOSIS — Z79899 Other long term (current) drug therapy: Secondary | ICD-10-CM | POA: Diagnosis not present

## 2017-10-21 DIAGNOSIS — Z7952 Long term (current) use of systemic steroids: Secondary | ICD-10-CM | POA: Diagnosis not present

## 2017-10-21 DIAGNOSIS — D485 Neoplasm of uncertain behavior of skin: Secondary | ICD-10-CM | POA: Diagnosis not present

## 2017-10-21 DIAGNOSIS — M354 Diffuse (eosinophilic) fasciitis: Secondary | ICD-10-CM | POA: Diagnosis not present

## 2017-10-21 DIAGNOSIS — C44329 Squamous cell carcinoma of skin of other parts of face: Secondary | ICD-10-CM | POA: Diagnosis not present

## 2017-10-21 DIAGNOSIS — L570 Actinic keratosis: Secondary | ICD-10-CM | POA: Diagnosis not present

## 2017-11-01 DIAGNOSIS — Z23 Encounter for immunization: Secondary | ICD-10-CM | POA: Diagnosis not present

## 2017-11-05 DIAGNOSIS — C44329 Squamous cell carcinoma of skin of other parts of face: Secondary | ICD-10-CM | POA: Diagnosis not present

## 2017-12-17 DIAGNOSIS — R5383 Other fatigue: Secondary | ICD-10-CM | POA: Diagnosis not present

## 2017-12-17 DIAGNOSIS — M8580 Other specified disorders of bone density and structure, unspecified site: Secondary | ICD-10-CM | POA: Diagnosis not present

## 2017-12-17 DIAGNOSIS — Z13220 Encounter for screening for lipoid disorders: Secondary | ICD-10-CM | POA: Diagnosis not present

## 2017-12-17 DIAGNOSIS — M354 Diffuse (eosinophilic) fasciitis: Secondary | ICD-10-CM | POA: Diagnosis not present

## 2017-12-17 DIAGNOSIS — Z0000 Encounter for general adult medical examination without abnormal findings: Secondary | ICD-10-CM | POA: Diagnosis not present

## 2017-12-17 DIAGNOSIS — M810 Age-related osteoporosis without current pathological fracture: Secondary | ICD-10-CM | POA: Diagnosis not present

## 2017-12-17 DIAGNOSIS — Z125 Encounter for screening for malignant neoplasm of prostate: Secondary | ICD-10-CM | POA: Diagnosis not present

## 2017-12-24 DIAGNOSIS — R5383 Other fatigue: Secondary | ICD-10-CM | POA: Diagnosis not present

## 2017-12-24 DIAGNOSIS — Z79899 Other long term (current) drug therapy: Secondary | ICD-10-CM | POA: Diagnosis not present

## 2017-12-24 DIAGNOSIS — M354 Diffuse (eosinophilic) fasciitis: Secondary | ICD-10-CM | POA: Diagnosis not present

## 2017-12-24 DIAGNOSIS — Z0000 Encounter for general adult medical examination without abnormal findings: Secondary | ICD-10-CM | POA: Diagnosis not present

## 2017-12-24 DIAGNOSIS — M810 Age-related osteoporosis without current pathological fracture: Secondary | ICD-10-CM | POA: Diagnosis not present

## 2017-12-24 DIAGNOSIS — F5101 Primary insomnia: Secondary | ICD-10-CM | POA: Diagnosis not present

## 2018-01-28 DIAGNOSIS — C4441 Basal cell carcinoma of skin of scalp and neck: Secondary | ICD-10-CM | POA: Diagnosis not present

## 2018-01-28 DIAGNOSIS — Z7952 Long term (current) use of systemic steroids: Secondary | ICD-10-CM | POA: Diagnosis not present

## 2018-01-28 DIAGNOSIS — Z5181 Encounter for therapeutic drug level monitoring: Secondary | ICD-10-CM | POA: Diagnosis not present

## 2018-01-28 DIAGNOSIS — M354 Diffuse (eosinophilic) fasciitis: Secondary | ICD-10-CM | POA: Diagnosis not present

## 2018-01-28 DIAGNOSIS — D485 Neoplasm of uncertain behavior of skin: Secondary | ICD-10-CM | POA: Diagnosis not present

## 2018-02-06 DIAGNOSIS — Z23 Encounter for immunization: Secondary | ICD-10-CM | POA: Diagnosis not present

## 2018-02-18 DIAGNOSIS — Z886 Allergy status to analgesic agent status: Secondary | ICD-10-CM | POA: Diagnosis not present

## 2018-02-18 DIAGNOSIS — C4441 Basal cell carcinoma of skin of scalp and neck: Secondary | ICD-10-CM | POA: Diagnosis not present

## 2018-06-03 DIAGNOSIS — Z5181 Encounter for therapeutic drug level monitoring: Secondary | ICD-10-CM | POA: Diagnosis not present

## 2018-06-03 DIAGNOSIS — Z79899 Other long term (current) drug therapy: Secondary | ICD-10-CM | POA: Diagnosis not present

## 2018-06-03 DIAGNOSIS — Z85828 Personal history of other malignant neoplasm of skin: Secondary | ICD-10-CM | POA: Diagnosis not present

## 2018-06-03 DIAGNOSIS — L940 Localized scleroderma [morphea]: Secondary | ICD-10-CM | POA: Diagnosis not present

## 2018-06-03 DIAGNOSIS — F17210 Nicotine dependence, cigarettes, uncomplicated: Secondary | ICD-10-CM | POA: Diagnosis not present

## 2018-06-03 DIAGNOSIS — Z7952 Long term (current) use of systemic steroids: Secondary | ICD-10-CM | POA: Diagnosis not present

## 2018-06-03 DIAGNOSIS — M354 Diffuse (eosinophilic) fasciitis: Secondary | ICD-10-CM | POA: Diagnosis not present

## 2018-06-23 DIAGNOSIS — H25013 Cortical age-related cataract, bilateral: Secondary | ICD-10-CM | POA: Diagnosis not present

## 2018-06-23 DIAGNOSIS — B399 Histoplasmosis, unspecified: Secondary | ICD-10-CM | POA: Diagnosis not present

## 2018-06-23 DIAGNOSIS — H35363 Drusen (degenerative) of macula, bilateral: Secondary | ICD-10-CM | POA: Diagnosis not present

## 2018-06-23 DIAGNOSIS — H2513 Age-related nuclear cataract, bilateral: Secondary | ICD-10-CM | POA: Diagnosis not present

## 2018-07-22 DIAGNOSIS — L940 Localized scleroderma [morphea]: Secondary | ICD-10-CM | POA: Diagnosis not present

## 2018-07-22 DIAGNOSIS — M728 Other fibroblastic disorders: Secondary | ICD-10-CM | POA: Diagnosis not present

## 2018-07-22 DIAGNOSIS — Z79899 Other long term (current) drug therapy: Secondary | ICD-10-CM | POA: Diagnosis not present

## 2018-07-22 DIAGNOSIS — Z7952 Long term (current) use of systemic steroids: Secondary | ICD-10-CM | POA: Diagnosis not present

## 2018-07-22 DIAGNOSIS — F17290 Nicotine dependence, other tobacco product, uncomplicated: Secondary | ICD-10-CM | POA: Diagnosis not present

## 2018-07-22 DIAGNOSIS — Z85828 Personal history of other malignant neoplasm of skin: Secondary | ICD-10-CM | POA: Diagnosis not present

## 2018-07-22 DIAGNOSIS — Z5181 Encounter for therapeutic drug level monitoring: Secondary | ICD-10-CM | POA: Diagnosis not present

## 2018-07-22 DIAGNOSIS — M354 Diffuse (eosinophilic) fasciitis: Secondary | ICD-10-CM | POA: Diagnosis not present

## 2018-10-21 DIAGNOSIS — L940 Localized scleroderma [morphea]: Secondary | ICD-10-CM | POA: Diagnosis not present

## 2018-10-21 DIAGNOSIS — L853 Xerosis cutis: Secondary | ICD-10-CM | POA: Diagnosis not present

## 2018-10-21 DIAGNOSIS — Z85828 Personal history of other malignant neoplasm of skin: Secondary | ICD-10-CM | POA: Diagnosis not present

## 2018-10-21 DIAGNOSIS — D7218 Eosinophilia in diseases classified elsewhere: Secondary | ICD-10-CM | POA: Diagnosis not present

## 2018-10-21 DIAGNOSIS — Z872 Personal history of diseases of the skin and subcutaneous tissue: Secondary | ICD-10-CM | POA: Diagnosis not present

## 2018-10-21 DIAGNOSIS — F17290 Nicotine dependence, other tobacco product, uncomplicated: Secondary | ICD-10-CM | POA: Diagnosis not present

## 2018-10-21 DIAGNOSIS — M354 Diffuse (eosinophilic) fasciitis: Secondary | ICD-10-CM | POA: Diagnosis not present

## 2018-10-21 DIAGNOSIS — Z5181 Encounter for therapeutic drug level monitoring: Secondary | ICD-10-CM | POA: Diagnosis not present

## 2018-10-21 DIAGNOSIS — Z79899 Other long term (current) drug therapy: Secondary | ICD-10-CM | POA: Diagnosis not present

## 2018-10-23 DIAGNOSIS — Z23 Encounter for immunization: Secondary | ICD-10-CM | POA: Diagnosis not present

## 2018-12-23 DIAGNOSIS — M354 Diffuse (eosinophilic) fasciitis: Secondary | ICD-10-CM | POA: Diagnosis not present

## 2018-12-23 DIAGNOSIS — R5383 Other fatigue: Secondary | ICD-10-CM | POA: Diagnosis not present

## 2018-12-23 DIAGNOSIS — Z0000 Encounter for general adult medical examination without abnormal findings: Secondary | ICD-10-CM | POA: Diagnosis not present

## 2018-12-30 DIAGNOSIS — M354 Diffuse (eosinophilic) fasciitis: Secondary | ICD-10-CM | POA: Diagnosis not present

## 2018-12-30 DIAGNOSIS — Z79899 Other long term (current) drug therapy: Secondary | ICD-10-CM | POA: Diagnosis not present

## 2018-12-30 DIAGNOSIS — M810 Age-related osteoporosis without current pathological fracture: Secondary | ICD-10-CM | POA: Diagnosis not present

## 2018-12-30 DIAGNOSIS — Z0000 Encounter for general adult medical examination without abnormal findings: Secondary | ICD-10-CM | POA: Diagnosis not present

## 2019-02-06 ENCOUNTER — Ambulatory Visit: Payer: Commercial Managed Care - HMO | Attending: Internal Medicine

## 2019-02-06 DIAGNOSIS — Z23 Encounter for immunization: Secondary | ICD-10-CM | POA: Insufficient documentation

## 2019-02-06 NOTE — Progress Notes (Signed)
   Covid-19 Vaccination Clinic  Name:  Brady Velasquez    MRN: [id number] DOB: 11-05-1945  02/06/2019  [name] was observed post Covid-19 immunization for 15 minutes without incidence. He was provided with Vaccine Information Sheet and instruction to access the V-Safe system.   Mr. Mention was instructed to call 911 with any severe reactions post vaccine: Marland Kitchen Difficulty breathing  . Swelling of your face and throat  . A fast heartbeat  . A bad rash all over your body  . Dizziness and weakness    Immunizations Administered    Name Date Dose VIS Date Route   Pfizer COVID-19 Vaccine 02/06/2019  6:36 PM 0.3 mL 12/26/2018 Intramuscular   Manufacturer: [place]   Lot: [id number]   [place]: [id number]

## 2019-02-24 DIAGNOSIS — Z79899 Other long term (current) drug therapy: Secondary | ICD-10-CM | POA: Diagnosis not present

## 2019-02-24 DIAGNOSIS — L853 Xerosis cutis: Secondary | ICD-10-CM | POA: Diagnosis not present

## 2019-02-24 DIAGNOSIS — L940 Localized scleroderma [morphea]: Secondary | ICD-10-CM | POA: Diagnosis not present

## 2019-02-24 DIAGNOSIS — Z85828 Personal history of other malignant neoplasm of skin: Secondary | ICD-10-CM | POA: Diagnosis not present

## 2019-02-24 DIAGNOSIS — Z872 Personal history of diseases of the skin and subcutaneous tissue: Secondary | ICD-10-CM | POA: Diagnosis not present

## 2019-02-24 DIAGNOSIS — Z5181 Encounter for therapeutic drug level monitoring: Secondary | ICD-10-CM | POA: Diagnosis not present

## 2019-02-26 ENCOUNTER — Ambulatory Visit: Payer: Medicare HMO | Attending: Internal Medicine

## 2019-02-26 DIAGNOSIS — Z23 Encounter for immunization: Secondary | ICD-10-CM

## 2019-02-26 NOTE — Progress Notes (Signed)
   Covid-19 Vaccination Clinic  Name:  Brady Velasquez    MRN: [id number] DOB: [birth year]  02/26/2019  [name] was observed post Covid-19 immunization for 15 minutes without incidence. He was provided with Vaccine Information Sheet and instruction to access the V-Safe system.   [name] was instructed to call 911 with any severe reactions post vaccine: Marland Kitchen Difficulty breathing  . Swelling of your face and throat  . A fast heartbeat  . A bad rash all over your body  . Dizziness and weakness    Immunizations Administered    Name Date Dose VIS Date Route   Pfizer COVID-19 Vaccine 02/26/2019  3:12 PM 0.3 mL 12/26/2018 Intramuscular   Manufacturer: [employer]   Lot: [id number]   [place]: [id number]

## 2019-04-28 DIAGNOSIS — Z85828 Personal history of other malignant neoplasm of skin: Secondary | ICD-10-CM | POA: Diagnosis not present

## 2019-04-28 DIAGNOSIS — Z79899 Other long term (current) drug therapy: Secondary | ICD-10-CM | POA: Diagnosis not present

## 2019-04-28 DIAGNOSIS — Z5181 Encounter for therapeutic drug level monitoring: Secondary | ICD-10-CM | POA: Diagnosis not present

## 2019-04-28 DIAGNOSIS — D7218 Eosinophilia in diseases classified elsewhere: Secondary | ICD-10-CM | POA: Diagnosis not present

## 2019-04-28 DIAGNOSIS — F17210 Nicotine dependence, cigarettes, uncomplicated: Secondary | ICD-10-CM | POA: Diagnosis not present

## 2019-04-28 DIAGNOSIS — L853 Xerosis cutis: Secondary | ICD-10-CM | POA: Diagnosis not present

## 2019-04-28 DIAGNOSIS — M354 Diffuse (eosinophilic) fasciitis: Secondary | ICD-10-CM | POA: Diagnosis not present

## 2019-04-28 DIAGNOSIS — L940 Localized scleroderma [morphea]: Secondary | ICD-10-CM | POA: Diagnosis not present

## 2019-05-25 DIAGNOSIS — H5203 Hypermetropia, bilateral: Secondary | ICD-10-CM | POA: Diagnosis not present

## 2019-05-25 DIAGNOSIS — H52209 Unspecified astigmatism, unspecified eye: Secondary | ICD-10-CM | POA: Diagnosis not present

## 2019-05-25 DIAGNOSIS — H524 Presbyopia: Secondary | ICD-10-CM | POA: Diagnosis not present

## 2019-06-02 DIAGNOSIS — F5101 Primary insomnia: Secondary | ICD-10-CM | POA: Diagnosis not present

## 2019-06-02 DIAGNOSIS — M354 Diffuse (eosinophilic) fasciitis: Secondary | ICD-10-CM | POA: Diagnosis not present

## 2019-06-02 DIAGNOSIS — Z79899 Other long term (current) drug therapy: Secondary | ICD-10-CM | POA: Diagnosis not present

## 2019-09-01 DIAGNOSIS — M354 Diffuse (eosinophilic) fasciitis: Secondary | ICD-10-CM | POA: Diagnosis not present

## 2019-09-01 DIAGNOSIS — Z5181 Encounter for therapeutic drug level monitoring: Secondary | ICD-10-CM | POA: Diagnosis not present

## 2019-09-01 DIAGNOSIS — D7218 Eosinophilia in diseases classified elsewhere: Secondary | ICD-10-CM | POA: Diagnosis not present

## 2019-09-01 DIAGNOSIS — Z79899 Other long term (current) drug therapy: Secondary | ICD-10-CM | POA: Diagnosis not present

## 2019-09-01 DIAGNOSIS — M728 Other fibroblastic disorders: Secondary | ICD-10-CM | POA: Diagnosis not present

## 2019-09-01 DIAGNOSIS — L940 Localized scleroderma [morphea]: Secondary | ICD-10-CM | POA: Diagnosis not present

## 2019-09-16 DIAGNOSIS — H6121 Impacted cerumen, right ear: Secondary | ICD-10-CM | POA: Diagnosis not present

## 2019-10-23 ENCOUNTER — Ambulatory Visit: Payer: Medicare HMO | Attending: Internal Medicine

## 2019-10-23 ENCOUNTER — Other Ambulatory Visit (HOSPITAL_BASED_OUTPATIENT_CLINIC_OR_DEPARTMENT_OTHER): Payer: Self-pay | Admitting: Internal Medicine

## 2019-10-23 DIAGNOSIS — Z23 Encounter for immunization: Secondary | ICD-10-CM

## 2019-10-23 MED FILL — FLUAD QUADRIVALENT 0.5 ML P: 0.5 | 1 days supply | Qty: 1 | Fill #0

## 2019-10-23 NOTE — Progress Notes (Signed)
   Covid-19 Vaccination Clinic  Name:  Brady Velasquez    MRN: [id number] DOB: 1945-06-18  10/23/2019  Brady Velasquez was observed post Covid-19 immunization for 15 minutes without incident. He was provided with Vaccine Information Sheet and instruction to access the V-Safe system. Vaccinated by [facility].  Brady Velasquez was instructed to call 911 with any severe reactions post vaccine: Marland Kitchen Difficulty breathing  . Swelling of face and throat  . A fast heartbeat  . A bad rash all over body  . Dizziness and weakness

## 2019-10-30 MED FILL — PFIZER-BIONTECH COVID-19 VA: 30 | 1 days supply | Qty: 0 | Fill #0

## 2019-12-29 DIAGNOSIS — M354 Diffuse (eosinophilic) fasciitis: Secondary | ICD-10-CM | POA: Diagnosis not present

## 2019-12-29 DIAGNOSIS — R5383 Other fatigue: Secondary | ICD-10-CM | POA: Diagnosis not present

## 2019-12-29 DIAGNOSIS — F5101 Primary insomnia: Secondary | ICD-10-CM | POA: Diagnosis not present

## 2019-12-29 DIAGNOSIS — Z79899 Other long term (current) drug therapy: Secondary | ICD-10-CM | POA: Diagnosis not present

## 2019-12-29 DIAGNOSIS — Z0000 Encounter for general adult medical examination without abnormal findings: Secondary | ICD-10-CM | POA: Diagnosis not present

## 2019-12-29 DIAGNOSIS — M810 Age-related osteoporosis without current pathological fracture: Secondary | ICD-10-CM | POA: Diagnosis not present

## 2020-01-04 DIAGNOSIS — Z7952 Long term (current) use of systemic steroids: Secondary | ICD-10-CM | POA: Diagnosis not present

## 2020-01-04 DIAGNOSIS — L940 Localized scleroderma [morphea]: Secondary | ICD-10-CM | POA: Diagnosis not present

## 2020-01-04 DIAGNOSIS — D7219 Other eosinophilia: Secondary | ICD-10-CM | POA: Diagnosis not present

## 2020-01-04 DIAGNOSIS — Z5181 Encounter for therapeutic drug level monitoring: Secondary | ICD-10-CM | POA: Diagnosis not present

## 2020-01-04 DIAGNOSIS — Z79899 Other long term (current) drug therapy: Secondary | ICD-10-CM | POA: Diagnosis not present

## 2020-01-04 DIAGNOSIS — M354 Diffuse (eosinophilic) fasciitis: Secondary | ICD-10-CM | POA: Diagnosis not present

## 2020-01-04 DIAGNOSIS — D7218 Eosinophilia in diseases classified elsewhere: Secondary | ICD-10-CM | POA: Diagnosis not present

## 2020-01-05 DIAGNOSIS — Z79899 Other long term (current) drug therapy: Secondary | ICD-10-CM | POA: Diagnosis not present

## 2020-01-05 DIAGNOSIS — M354 Diffuse (eosinophilic) fasciitis: Secondary | ICD-10-CM | POA: Diagnosis not present

## 2020-01-05 DIAGNOSIS — H9192 Unspecified hearing loss, left ear: Secondary | ICD-10-CM | POA: Diagnosis not present

## 2020-01-05 DIAGNOSIS — M8580 Other specified disorders of bone density and structure, unspecified site: Secondary | ICD-10-CM | POA: Diagnosis not present

## 2020-01-05 DIAGNOSIS — Z0000 Encounter for general adult medical examination without abnormal findings: Secondary | ICD-10-CM | POA: Diagnosis not present

## 2020-04-20 ENCOUNTER — Ambulatory Visit: Payer: Medicare HMO

## 2020-04-27 ENCOUNTER — Ambulatory Visit: Payer: Medicare HMO

## 2020-05-03 DIAGNOSIS — Z5181 Encounter for therapeutic drug level monitoring: Secondary | ICD-10-CM | POA: Diagnosis not present

## 2020-05-03 DIAGNOSIS — D7219 Other eosinophilia: Secondary | ICD-10-CM | POA: Diagnosis not present

## 2020-05-03 DIAGNOSIS — Z79899 Other long term (current) drug therapy: Secondary | ICD-10-CM | POA: Diagnosis not present

## 2020-05-03 DIAGNOSIS — L940 Localized scleroderma [morphea]: Secondary | ICD-10-CM | POA: Diagnosis not present

## 2020-05-03 DIAGNOSIS — M354 Diffuse (eosinophilic) fasciitis: Secondary | ICD-10-CM | POA: Diagnosis not present

## 2020-08-17 DIAGNOSIS — M8580 Other specified disorders of bone density and structure, unspecified site: Secondary | ICD-10-CM | POA: Diagnosis not present

## 2020-08-17 DIAGNOSIS — R5383 Other fatigue: Secondary | ICD-10-CM | POA: Diagnosis not present

## 2020-08-17 DIAGNOSIS — Z79899 Other long term (current) drug therapy: Secondary | ICD-10-CM | POA: Diagnosis not present

## 2020-08-17 DIAGNOSIS — D692 Other nonthrombocytopenic purpura: Secondary | ICD-10-CM | POA: Diagnosis not present

## 2020-08-17 DIAGNOSIS — M354 Diffuse (eosinophilic) fasciitis: Secondary | ICD-10-CM | POA: Diagnosis not present

## 2020-08-17 DIAGNOSIS — H9192 Unspecified hearing loss, left ear: Secondary | ICD-10-CM | POA: Diagnosis not present

## 2020-09-05 DIAGNOSIS — R7989 Other specified abnormal findings of blood chemistry: Secondary | ICD-10-CM | POA: Diagnosis not present

## 2020-09-05 DIAGNOSIS — L940 Localized scleroderma [morphea]: Secondary | ICD-10-CM | POA: Diagnosis not present

## 2020-09-05 DIAGNOSIS — Z5181 Encounter for therapeutic drug level monitoring: Secondary | ICD-10-CM | POA: Diagnosis not present

## 2020-09-05 DIAGNOSIS — D7218 Eosinophilia in diseases classified elsewhere: Secondary | ICD-10-CM | POA: Diagnosis not present

## 2020-10-17 ENCOUNTER — Ambulatory Visit: Payer: Medicare HMO | Attending: Internal Medicine

## 2020-10-17 DIAGNOSIS — Z23 Encounter for immunization: Secondary | ICD-10-CM

## 2020-10-17 NOTE — Progress Notes (Signed)
   Covid-19 Vaccination Clinic  Name:  Brady Velasquez    MRN: [id number] DOB: 04/15/1945  10/17/2020  [name] was observed post Covid-19 immunization for 15 minutes without incident. He was provided with Vaccine Information Sheet and instruction to access the V-Safe system.   [name] was instructed to call 911 with any severe reactions post vaccine: Difficulty breathing  Swelling of face and throat  A fast heartbeat  A bad rash all over body  Dizziness and weakness

## 2020-10-25 ENCOUNTER — Other Ambulatory Visit (HOSPITAL_BASED_OUTPATIENT_CLINIC_OR_DEPARTMENT_OTHER): Payer: Self-pay

## 2020-10-25 MED ORDER — COVID-19MRNA BIVAL VACC PFIZER 30 MCG/0.3ML IM SUSP
INTRAMUSCULAR | 0 refills | Status: DC
  Filled 2020-10-25: qty 0.3, 1d supply, fill #0

## 2020-11-11 DIAGNOSIS — Z23 Encounter for immunization: Secondary | ICD-10-CM | POA: Diagnosis not present

## 2020-12-06 DIAGNOSIS — H2513 Age-related nuclear cataract, bilateral: Secondary | ICD-10-CM | POA: Diagnosis not present

## 2020-12-06 DIAGNOSIS — B399 Histoplasmosis, unspecified: Secondary | ICD-10-CM | POA: Diagnosis not present

## 2020-12-06 DIAGNOSIS — H35363 Drusen (degenerative) of macula, bilateral: Secondary | ICD-10-CM | POA: Diagnosis not present

## 2020-12-06 DIAGNOSIS — H25013 Cortical age-related cataract, bilateral: Secondary | ICD-10-CM | POA: Diagnosis not present

## 2020-12-06 DIAGNOSIS — H524 Presbyopia: Secondary | ICD-10-CM | POA: Diagnosis not present

## 2020-12-27 DIAGNOSIS — R918 Other nonspecific abnormal finding of lung field: Secondary | ICD-10-CM | POA: Diagnosis not present

## 2020-12-27 DIAGNOSIS — R059 Cough, unspecified: Secondary | ICD-10-CM | POA: Diagnosis not present

## 2021-02-01 DIAGNOSIS — Z79899 Other long term (current) drug therapy: Secondary | ICD-10-CM | POA: Diagnosis not present

## 2021-02-01 DIAGNOSIS — R5383 Other fatigue: Secondary | ICD-10-CM | POA: Diagnosis not present

## 2021-02-01 DIAGNOSIS — Z0000 Encounter for general adult medical examination without abnormal findings: Secondary | ICD-10-CM | POA: Diagnosis not present

## 2021-02-01 DIAGNOSIS — M8580 Other specified disorders of bone density and structure, unspecified site: Secondary | ICD-10-CM | POA: Diagnosis not present

## 2021-02-01 DIAGNOSIS — M354 Diffuse (eosinophilic) fasciitis: Secondary | ICD-10-CM | POA: Diagnosis not present

## 2021-02-08 DIAGNOSIS — K2100 Gastro-esophageal reflux disease with esophagitis, without bleeding: Secondary | ICD-10-CM | POA: Diagnosis not present

## 2021-02-08 DIAGNOSIS — D692 Other nonthrombocytopenic purpura: Secondary | ICD-10-CM | POA: Diagnosis not present

## 2021-02-08 DIAGNOSIS — R918 Other nonspecific abnormal finding of lung field: Secondary | ICD-10-CM | POA: Diagnosis not present

## 2021-02-08 DIAGNOSIS — M354 Diffuse (eosinophilic) fasciitis: Secondary | ICD-10-CM | POA: Diagnosis not present

## 2021-02-08 DIAGNOSIS — Z79899 Other long term (current) drug therapy: Secondary | ICD-10-CM | POA: Diagnosis not present

## 2021-02-08 DIAGNOSIS — Z0000 Encounter for general adult medical examination without abnormal findings: Secondary | ICD-10-CM | POA: Diagnosis not present

## 2021-02-14 DIAGNOSIS — H6123 Impacted cerumen, bilateral: Secondary | ICD-10-CM | POA: Diagnosis not present

## 2021-02-17 ENCOUNTER — Telehealth: Payer: Self-pay | Admitting: *Deleted

## 2021-02-17 NOTE — Telephone Encounter (Signed)
Patient has scheduled an appointment with Dr Hilarie Fredrickson for his first available appointment, 02/22/21. He indicates that he will try to find his previous colonoscopy records and bring them for his appointment.

## 2021-02-17 NOTE — Telephone Encounter (Signed)
-----   Message from Jerene Bears, MD sent at 02/16/2021  1:37 PM EST ----- This is Brady Velasquez dad He needs a visit (not urgent) for GERD He is taking Nexium 20 OTC which I told her to tell him he could continue until I see him I bet he will need EGD Ask if he has colon records.  Possible at [employer]

## 2021-02-21 ENCOUNTER — Encounter: Payer: Self-pay | Admitting: *Deleted

## 2021-02-21 ENCOUNTER — Other Ambulatory Visit: Payer: Self-pay | Admitting: *Deleted

## 2021-02-21 DIAGNOSIS — Z79899 Other long term (current) drug therapy: Secondary | ICD-10-CM | POA: Insufficient documentation

## 2021-02-21 DIAGNOSIS — K2100 Gastro-esophageal reflux disease with esophagitis, without bleeding: Secondary | ICD-10-CM | POA: Insufficient documentation

## 2021-02-21 DIAGNOSIS — F5101 Primary insomnia: Secondary | ICD-10-CM | POA: Insufficient documentation

## 2021-02-21 DIAGNOSIS — H9190 Unspecified hearing loss, unspecified ear: Secondary | ICD-10-CM | POA: Insufficient documentation

## 2021-02-21 DIAGNOSIS — M8580 Other specified disorders of bone density and structure, unspecified site: Secondary | ICD-10-CM | POA: Insufficient documentation

## 2021-02-22 ENCOUNTER — Encounter: Payer: Self-pay | Admitting: Internal Medicine

## 2021-02-22 ENCOUNTER — Other Ambulatory Visit: Payer: Self-pay

## 2021-02-22 ENCOUNTER — Ambulatory Visit: Payer: Medicare HMO | Admitting: Internal Medicine

## 2021-02-22 VITALS — BP 140/70 | HR 82 | Ht 69.5 in | Wt 142.0 lb

## 2021-02-22 DIAGNOSIS — Z86010 Personal history of colonic polyps: Secondary | ICD-10-CM | POA: Diagnosis not present

## 2021-02-22 DIAGNOSIS — K219 Gastro-esophageal reflux disease without esophagitis: Secondary | ICD-10-CM

## 2021-02-22 MED ORDER — ESOMEPRAZOLE MAGNESIUM 20 MG PO CPDR: 20.0000 mg | DELAYED_RELEASE_CAPSULE | Freq: Every day | ORAL | 2 refills | Status: DC

## 2021-02-22 NOTE — Patient Instructions (Signed)
We have sent the following medications to your pharmacy for you to pick up at your convenience: Nexium   We will try to obtain records from [facility]- Dr Oletta Lamas.   It has been recommended to you by your physician that you have a(n) Colon/EGD completed. Per your request, we did not schedule the procedure(s) today. Please contact our office at [phone] should you decide to have the procedure completed. You will be scheduled for a pre-visit and procedure at that time.  If you are age 14 or older, your body mass index should be between 23-30. Your Body mass index is 20.67 kg/m. If this is out of the aforementioned range listed, please consider follow up with your Primary Care Provider.  If you are age 34 or younger, your body mass index should be between 19-25. Your Body mass index is 20.67 kg/m. If this is out of the aformentioned range listed, please consider follow up with your Primary Care Provider.   ________________________________________________________  The [facility] providers would like to encourage you to use [facility] to communicate with providers for non-urgent requests or questions.  Due to long hold times on the telephone, sending your provider a message by [facility] may be a faster and more efficient way to get a response.  Please allow 48 business hours for a response.  Please remember that this is for non-urgent requests.  _______________________________________________________  Thank you for choosing me and [facility].  Dr. Ulice Dash Pyrtle

## 2021-02-24 ENCOUNTER — Encounter: Payer: Self-pay | Admitting: Internal Medicine

## 2021-02-24 NOTE — Progress Notes (Signed)
Patient ID: Brady Velasquez, male   DOB: 01/16/1945, 76 y.o.   MRN: [id number] HPI: Brady Velasquez is a 76 year old male with a history of GERD, eosinophilic fasciitis, morphea profunda currently treated with mycophenolate and low-dose prednisone who is seen to evaluate GERD symptoms.  He is here alone today.  He has previous [facility] history with [facility] and has had colonoscopy in the past.  He feels his last colonoscopy was about 10 years ago.  He recalls a small polyp being removed and he was told this was benign.  He does not recall being specifically told when his next colonoscopy was recommended.  He reports on and off issues with heartburn which for him is predominantly pyrosis.  It responds very well to Nexium 20 mg.  He takes this over-the-counter typically for 14 days in a row.  Symptoms abate completely but then will come back within 2 to 3 weeks.  He uses Tums particularly at night.  He knows that symptoms are worse if he eats late, eats spicy foods and drinks alcohol.  He reports he likes doing all those things.  No dysphagia.  He will occasionally have epigastric pain associated with his reflux but this also resolved with Nexium.  He has never had upper endoscopy.  Bowel movements are regular usually 2/day.  No changes in bowel movements.  No blood in stool or melena.  No family history of colon cancer.  His mother died of a myocardial infarction and his father died of a stroke.  He sees [facility] at [facility] for his morphea profunda and eosinophilic fasciitis.  Past Medical History:  Diagnosis Date   Basal cell carcinoma    Eosinophilic fasciitis    GERD (gastroesophageal reflux disease)    Localized scleroderma    Morphea profunda    Osteopenia    Primary insomnia     Past Surgical History:  Procedure Laterality Date   COLONOSCOPY     TONSILLECTOMY     TOOTH EXTRACTION      Outpatient Medications Prior to Visit  Medication Sig Dispense Refill   Calcium  Carb-Cholecalciferol 600-5 MG-MCG TABS Take 1 tablet by mouth daily.     Cholecalciferol (VITAMIN D3) 25 MCG (1000 UT) CAPS Take 1 capsule by mouth daily.     folic acid (FOLVITE) 1 MG tablet Take 1 tablet by mouth daily.     LORazepam (ATIVAN) 0.5 MG tablet Take 1 tablet by mouth at bedtime as needed.     Mycophenolate Mofetil 500 MG SOLR 500 mg 2 (two) times daily.     predniSONE (DELTASONE) 5 MG tablet Take 5 mg by mouth daily.     vitamin B-12 (CYANOCOBALAMIN) 500 MCG tablet Take 1 tablet by mouth daily.     No facility-administered medications prior to visit.    Allergies  Allergen Reactions   Meloxicam     unknown    Family History  Problem Relation Age of Onset   CVA Father    Cancer Father    Colon cancer Neg Hx    Pancreatic cancer Neg Hx    Esophageal cancer Neg Hx    Liver cancer Neg Hx     Social History   Tobacco Use   Smoking status: Former    Types: Cigarettes  Substance Use Topics   Alcohol use: Yes    Comment: daily   Drug use: No    ROS: As per history of present illness, otherwise negative  BP 140/70  Pulse 82    Ht 5' 9.5" (1.765 m)    Wt 142 lb (64.4 kg)    SpO2 94%    BMI 20.67 kg/m  Gen: awake, alert, NAD HEENT: anicteric CV: RRR, no mrg Pulm: CTA b/l Abd: soft, NT/ND, +BS throughout Ext: no c/c/e Skin: Notably thickened and firm throughout Neuro: nonfocal   ASSESSMENT/PLAN:  76 year old male with a history of GERD, eosinophilic fasciitis, morphea profunda currently treated with mycophenolate and low-dose prednisone who is seen to evaluate GERD symptoms.   GERD --completely responsive to low-dose Nexium 20 mg daily.  He does not have alarm symptoms.  He has never had an upper endoscopy.  We discussed the risk, benefits and alternatives to chronic PPI therapy at low-dose.  I think it is safe for him to use Nexium 20 mg on an ongoing basis once daily.  I do recommend that he follow his bone density with routine scans with primary care as  well as pain attention to his magnesium levels.  He would like to hold off on upper endoscopy but I do think performing upper endoscopy is reasonable given his history of reflux to exclude esophagitis and Barrett's. --Nexium 20 mg daily --EGD if and when the patient agreeable  2.  History of colon polyps/colon cancer screening --unclear if previous polyps removed were adenomatous or hyperplastic.  We will attempt to obtain records from [facility] with [facility].  He feels his last colonoscopy was about 10 years ago which would make him due for screening/surveillance at this time --We discussed colonoscopy which I recommended, he would like to think more about this and discuss with family --Await records from [facility]      [id number], Brady Velasquez, Brady [facility],  [facility] [id number]

## 2021-02-25 ENCOUNTER — Encounter: Payer: Self-pay | Admitting: Internal Medicine

## 2021-03-08 DIAGNOSIS — Z7969 Long term (current) use of other immunomodulators and immunosuppressants: Secondary | ICD-10-CM | POA: Diagnosis not present

## 2021-03-08 DIAGNOSIS — M354 Diffuse (eosinophilic) fasciitis: Secondary | ICD-10-CM | POA: Diagnosis not present

## 2021-03-08 DIAGNOSIS — D7218 Eosinophilia in diseases classified elsewhere: Secondary | ICD-10-CM | POA: Diagnosis not present

## 2021-03-08 DIAGNOSIS — L940 Localized scleroderma [morphea]: Secondary | ICD-10-CM | POA: Diagnosis not present

## 2021-03-08 DIAGNOSIS — Z7952 Long term (current) use of systemic steroids: Secondary | ICD-10-CM | POA: Diagnosis not present

## 2021-03-08 DIAGNOSIS — Z5181 Encounter for therapeutic drug level monitoring: Secondary | ICD-10-CM | POA: Diagnosis not present

## 2021-04-20 DIAGNOSIS — F17210 Nicotine dependence, cigarettes, uncomplicated: Secondary | ICD-10-CM | POA: Diagnosis not present

## 2021-04-20 DIAGNOSIS — H6123 Impacted cerumen, bilateral: Secondary | ICD-10-CM | POA: Diagnosis not present

## 2021-05-18 DIAGNOSIS — M5442 Lumbago with sciatica, left side: Secondary | ICD-10-CM | POA: Diagnosis not present

## 2021-05-29 ENCOUNTER — Other Ambulatory Visit: Payer: Self-pay

## 2021-06-15 DIAGNOSIS — M5459 Other low back pain: Secondary | ICD-10-CM | POA: Diagnosis not present

## 2021-06-15 DIAGNOSIS — M25562 Pain in left knee: Secondary | ICD-10-CM | POA: Diagnosis not present

## 2021-06-21 DIAGNOSIS — M5459 Other low back pain: Secondary | ICD-10-CM | POA: Diagnosis not present

## 2021-06-21 DIAGNOSIS — M25562 Pain in left knee: Secondary | ICD-10-CM | POA: Diagnosis not present

## 2021-06-28 ENCOUNTER — Other Ambulatory Visit: Payer: Self-pay

## 2021-06-29 DIAGNOSIS — M5459 Other low back pain: Secondary | ICD-10-CM | POA: Diagnosis not present

## 2021-06-29 DIAGNOSIS — M25562 Pain in left knee: Secondary | ICD-10-CM | POA: Diagnosis not present

## 2021-07-05 ENCOUNTER — Other Ambulatory Visit: Payer: Self-pay

## 2021-07-05 DIAGNOSIS — M25562 Pain in left knee: Secondary | ICD-10-CM | POA: Diagnosis not present

## 2021-07-05 DIAGNOSIS — M5459 Other low back pain: Secondary | ICD-10-CM | POA: Diagnosis not present

## 2021-07-10 DIAGNOSIS — L940 Localized scleroderma [morphea]: Secondary | ICD-10-CM | POA: Diagnosis not present

## 2021-07-10 DIAGNOSIS — Z7952 Long term (current) use of systemic steroids: Secondary | ICD-10-CM | POA: Diagnosis not present

## 2021-07-10 DIAGNOSIS — Z7969 Long term (current) use of other immunomodulators and immunosuppressants: Secondary | ICD-10-CM | POA: Diagnosis not present

## 2021-07-31 DIAGNOSIS — D692 Other nonthrombocytopenic purpura: Secondary | ICD-10-CM | POA: Diagnosis not present

## 2021-07-31 DIAGNOSIS — Z79899 Other long term (current) drug therapy: Secondary | ICD-10-CM | POA: Diagnosis not present

## 2021-07-31 DIAGNOSIS — R918 Other nonspecific abnormal finding of lung field: Secondary | ICD-10-CM | POA: Diagnosis not present

## 2021-07-31 DIAGNOSIS — M354 Diffuse (eosinophilic) fasciitis: Secondary | ICD-10-CM | POA: Diagnosis not present

## 2021-07-31 DIAGNOSIS — K2100 Gastro-esophageal reflux disease with esophagitis, without bleeding: Secondary | ICD-10-CM | POA: Diagnosis not present

## 2021-07-31 DIAGNOSIS — L940 Localized scleroderma [morphea]: Secondary | ICD-10-CM | POA: Diagnosis not present

## 2021-11-03 ENCOUNTER — Other Ambulatory Visit: Payer: Self-pay | Admitting: Internal Medicine

## 2021-11-09 DIAGNOSIS — Z23 Encounter for immunization: Secondary | ICD-10-CM | POA: Diagnosis not present

## 2021-11-13 DIAGNOSIS — D7218 Eosinophilia in diseases classified elsewhere: Secondary | ICD-10-CM | POA: Diagnosis not present

## 2021-11-13 DIAGNOSIS — Z5181 Encounter for therapeutic drug level monitoring: Secondary | ICD-10-CM | POA: Diagnosis not present

## 2021-11-13 DIAGNOSIS — L940 Localized scleroderma [morphea]: Secondary | ICD-10-CM | POA: Diagnosis not present

## 2021-11-13 DIAGNOSIS — Z872 Personal history of diseases of the skin and subcutaneous tissue: Secondary | ICD-10-CM | POA: Diagnosis not present

## 2021-11-13 DIAGNOSIS — Z79624 Long term (current) use of inhibitors of nucleotide synthesis: Secondary | ICD-10-CM | POA: Diagnosis not present

## 2021-11-27 ENCOUNTER — Other Ambulatory Visit (HOSPITAL_BASED_OUTPATIENT_CLINIC_OR_DEPARTMENT_OTHER): Payer: Self-pay

## 2021-11-27 MED ORDER — COMIRNATY 30 MCG/0.3ML IM SUSY
PREFILLED_SYRINGE | INTRAMUSCULAR | 0 refills | Status: AC
  Filled 2021-11-27: qty 0.3, 1d supply, fill #0

## 2022-01-30 DIAGNOSIS — H6123 Impacted cerumen, bilateral: Secondary | ICD-10-CM | POA: Diagnosis not present

## 2022-02-19 DIAGNOSIS — Z79899 Other long term (current) drug therapy: Secondary | ICD-10-CM | POA: Diagnosis not present

## 2022-02-19 DIAGNOSIS — L940 Localized scleroderma [morphea]: Secondary | ICD-10-CM | POA: Diagnosis not present

## 2022-02-19 DIAGNOSIS — D7218 Eosinophilia in diseases classified elsewhere: Secondary | ICD-10-CM | POA: Diagnosis not present

## 2022-02-19 DIAGNOSIS — M354 Diffuse (eosinophilic) fasciitis: Secondary | ICD-10-CM | POA: Diagnosis not present

## 2022-02-19 DIAGNOSIS — Z0000 Encounter for general adult medical examination without abnormal findings: Secondary | ICD-10-CM | POA: Diagnosis not present

## 2022-02-19 DIAGNOSIS — K2100 Gastro-esophageal reflux disease with esophagitis, without bleeding: Secondary | ICD-10-CM | POA: Diagnosis not present

## 2022-02-19 DIAGNOSIS — D692 Other nonthrombocytopenic purpura: Secondary | ICD-10-CM | POA: Diagnosis not present

## 2022-02-19 DIAGNOSIS — R5383 Other fatigue: Secondary | ICD-10-CM | POA: Diagnosis not present

## 2022-02-19 DIAGNOSIS — Z5181 Encounter for therapeutic drug level monitoring: Secondary | ICD-10-CM | POA: Diagnosis not present

## 2022-02-19 DIAGNOSIS — R918 Other nonspecific abnormal finding of lung field: Secondary | ICD-10-CM | POA: Diagnosis not present

## 2022-02-26 DIAGNOSIS — D692 Other nonthrombocytopenic purpura: Secondary | ICD-10-CM | POA: Diagnosis not present

## 2022-02-26 DIAGNOSIS — M810 Age-related osteoporosis without current pathological fracture: Secondary | ICD-10-CM | POA: Diagnosis not present

## 2022-02-26 DIAGNOSIS — K2100 Gastro-esophageal reflux disease with esophagitis, without bleeding: Secondary | ICD-10-CM | POA: Diagnosis not present

## 2022-02-26 DIAGNOSIS — Z0000 Encounter for general adult medical examination without abnormal findings: Secondary | ICD-10-CM | POA: Diagnosis not present

## 2022-02-26 DIAGNOSIS — M8580 Other specified disorders of bone density and structure, unspecified site: Secondary | ICD-10-CM | POA: Diagnosis not present

## 2022-02-26 DIAGNOSIS — Z79899 Other long term (current) drug therapy: Secondary | ICD-10-CM | POA: Diagnosis not present

## 2022-02-26 DIAGNOSIS — M354 Diffuse (eosinophilic) fasciitis: Secondary | ICD-10-CM | POA: Diagnosis not present

## 2022-02-26 DIAGNOSIS — R634 Abnormal weight loss: Secondary | ICD-10-CM | POA: Diagnosis not present

## 2022-05-07 ENCOUNTER — Other Ambulatory Visit: Payer: Self-pay

## 2022-05-09 ENCOUNTER — Other Ambulatory Visit: Payer: Self-pay | Admitting: Internal Medicine

## 2022-06-20 ENCOUNTER — Other Ambulatory Visit: Payer: Self-pay

## 2022-06-20 DIAGNOSIS — Z5181 Encounter for therapeutic drug level monitoring: Secondary | ICD-10-CM | POA: Diagnosis not present

## 2022-06-20 DIAGNOSIS — M354 Diffuse (eosinophilic) fasciitis: Secondary | ICD-10-CM | POA: Diagnosis not present

## 2022-06-20 DIAGNOSIS — L940 Localized scleroderma [morphea]: Secondary | ICD-10-CM | POA: Diagnosis not present

## 2022-06-20 DIAGNOSIS — D7218 Eosinophilia in diseases classified elsewhere: Secondary | ICD-10-CM | POA: Diagnosis not present

## 2022-08-08 ENCOUNTER — Other Ambulatory Visit: Payer: Self-pay | Admitting: Internal Medicine

## 2022-08-27 DIAGNOSIS — K2100 Gastro-esophageal reflux disease with esophagitis, without bleeding: Secondary | ICD-10-CM | POA: Diagnosis not present

## 2022-08-27 DIAGNOSIS — R634 Abnormal weight loss: Secondary | ICD-10-CM | POA: Diagnosis not present

## 2022-08-27 DIAGNOSIS — M8580 Other specified disorders of bone density and structure, unspecified site: Secondary | ICD-10-CM | POA: Diagnosis not present

## 2022-08-27 DIAGNOSIS — M354 Diffuse (eosinophilic) fasciitis: Secondary | ICD-10-CM | POA: Diagnosis not present

## 2022-08-28 ENCOUNTER — Other Ambulatory Visit: Payer: Self-pay | Admitting: Internal Medicine

## 2022-08-28 DIAGNOSIS — I700 Atherosclerosis of aorta: Secondary | ICD-10-CM | POA: Diagnosis not present

## 2022-08-28 DIAGNOSIS — K7689 Other specified diseases of liver: Secondary | ICD-10-CM | POA: Diagnosis not present

## 2022-08-28 DIAGNOSIS — R634 Abnormal weight loss: Secondary | ICD-10-CM | POA: Diagnosis not present

## 2022-08-30 ENCOUNTER — Encounter: Payer: Self-pay | Admitting: Internal Medicine

## 2022-09-04 ENCOUNTER — Telehealth: Payer: Self-pay | Admitting: Internal Medicine

## 2022-09-04 DIAGNOSIS — H6123 Impacted cerumen, bilateral: Secondary | ICD-10-CM | POA: Diagnosis not present

## 2022-09-04 MED ORDER — ESOMEPRAZOLE MAGNESIUM 20 MG PO CPDR: 20.0000 mg | DELAYED_RELEASE_CAPSULE | Freq: Every day | ORAL | 0 refills | Status: AC

## 2022-09-04 NOTE — Telephone Encounter (Signed)
Prescription sent to patient's pharmacy. Informed patient on script that he needs a follow visit for further refills.

## 2022-09-04 NOTE — Telephone Encounter (Signed)
Patient sent a MyChart message requesting a refill for esomeprazole DR 20 mg caps.  Thank you.

## 2022-09-14 ENCOUNTER — Ambulatory Visit
Admission: RE | Admit: 2022-09-14 | Discharge: 2022-09-14 | Disposition: A | Discharge status: Home / Self Care | Payer: Medicare HMO | Source: Ambulatory Visit | Attending: Internal Medicine | Admitting: Internal Medicine

## 2022-09-14 ENCOUNTER — Other Ambulatory Visit: Payer: Self-pay | Admitting: Internal Medicine

## 2022-09-14 DIAGNOSIS — J439 Emphysema, unspecified: Secondary | ICD-10-CM | POA: Diagnosis not present

## 2022-09-14 DIAGNOSIS — R634 Abnormal weight loss: Secondary | ICD-10-CM

## 2022-09-14 DIAGNOSIS — K7689 Other specified diseases of liver: Secondary | ICD-10-CM | POA: Diagnosis not present

## 2022-09-14 DIAGNOSIS — I700 Atherosclerosis of aorta: Secondary | ICD-10-CM | POA: Diagnosis not present

## 2022-09-14 MED ORDER — IOPAMIDOL (ISOVUE-300) INJECTION 61%
500.0000 mL | Freq: Once | INTRAVENOUS | Status: AC | PRN
  Administered 2022-09-14: 100 mL via INTRAVENOUS

## 2022-09-24 DIAGNOSIS — R634 Abnormal weight loss: Secondary | ICD-10-CM | POA: Diagnosis not present

## 2022-10-01 ENCOUNTER — Other Ambulatory Visit: Payer: Self-pay | Admitting: Internal Medicine

## 2022-10-29 DIAGNOSIS — L940 Localized scleroderma [morphea]: Secondary | ICD-10-CM | POA: Diagnosis not present

## 2022-10-29 DIAGNOSIS — M354 Diffuse (eosinophilic) fasciitis: Secondary | ICD-10-CM | POA: Diagnosis not present

## 2022-10-29 DIAGNOSIS — D7218 Eosinophilia in diseases classified elsewhere: Secondary | ICD-10-CM | POA: Diagnosis not present

## 2022-11-02 DIAGNOSIS — Z23 Encounter for immunization: Secondary | ICD-10-CM | POA: Diagnosis not present

## 2022-11-14 ENCOUNTER — Other Ambulatory Visit (HOSPITAL_BASED_OUTPATIENT_CLINIC_OR_DEPARTMENT_OTHER): Payer: Self-pay

## 2022-11-14 MED ORDER — COMIRNATY 30 MCG/0.3ML IM SUSY
0.3000 mL | PREFILLED_SYRINGE | Freq: Once | INTRAMUSCULAR | 0 refills | Status: AC
  Filled 2022-11-14: qty 0.3, 1d supply, fill #0

## 2022-12-07 DIAGNOSIS — H2513 Age-related nuclear cataract, bilateral: Secondary | ICD-10-CM | POA: Diagnosis not present

## 2022-12-07 DIAGNOSIS — H52203 Unspecified astigmatism, bilateral: Secondary | ICD-10-CM | POA: Diagnosis not present

## 2023-01-17 DIAGNOSIS — H6123 Impacted cerumen, bilateral: Secondary | ICD-10-CM | POA: Diagnosis not present

## 2023-02-19 DIAGNOSIS — Z5181 Encounter for therapeutic drug level monitoring: Secondary | ICD-10-CM | POA: Diagnosis not present

## 2023-02-19 DIAGNOSIS — M354 Diffuse (eosinophilic) fasciitis: Secondary | ICD-10-CM | POA: Diagnosis not present

## 2023-02-19 DIAGNOSIS — L940 Localized scleroderma [morphea]: Secondary | ICD-10-CM | POA: Diagnosis not present

## 2023-02-19 DIAGNOSIS — D7218 Eosinophilia in diseases classified elsewhere: Secondary | ICD-10-CM | POA: Diagnosis not present

## 2023-02-25 DIAGNOSIS — Z0000 Encounter for general adult medical examination without abnormal findings: Secondary | ICD-10-CM | POA: Diagnosis not present

## 2023-02-25 DIAGNOSIS — Z79899 Other long term (current) drug therapy: Secondary | ICD-10-CM | POA: Diagnosis not present

## 2023-02-25 DIAGNOSIS — R5383 Other fatigue: Secondary | ICD-10-CM | POA: Diagnosis not present

## 2023-03-04 DIAGNOSIS — M8580 Other specified disorders of bone density and structure, unspecified site: Secondary | ICD-10-CM | POA: Diagnosis not present

## 2023-03-04 DIAGNOSIS — K2100 Gastro-esophageal reflux disease with esophagitis, without bleeding: Secondary | ICD-10-CM | POA: Diagnosis not present

## 2023-03-04 DIAGNOSIS — Z79899 Other long term (current) drug therapy: Secondary | ICD-10-CM | POA: Diagnosis not present

## 2023-03-04 DIAGNOSIS — D692 Other nonthrombocytopenic purpura: Secondary | ICD-10-CM | POA: Diagnosis not present

## 2023-03-04 DIAGNOSIS — L940 Localized scleroderma [morphea]: Secondary | ICD-10-CM | POA: Diagnosis not present

## 2023-03-04 DIAGNOSIS — Z23 Encounter for immunization: Secondary | ICD-10-CM | POA: Diagnosis not present

## 2023-03-04 DIAGNOSIS — Z0000 Encounter for general adult medical examination without abnormal findings: Secondary | ICD-10-CM | POA: Diagnosis not present

## 2023-05-20 DIAGNOSIS — H6123 Impacted cerumen, bilateral: Secondary | ICD-10-CM | POA: Diagnosis not present

## 2023-06-19 DIAGNOSIS — Z5181 Encounter for therapeutic drug level monitoring: Secondary | ICD-10-CM | POA: Diagnosis not present

## 2023-06-19 DIAGNOSIS — D7218 Eosinophilia in diseases classified elsewhere: Secondary | ICD-10-CM | POA: Diagnosis not present

## 2023-06-19 DIAGNOSIS — M354 Diffuse (eosinophilic) fasciitis: Secondary | ICD-10-CM | POA: Diagnosis not present

## 2023-06-19 DIAGNOSIS — L940 Localized scleroderma [morphea]: Secondary | ICD-10-CM | POA: Diagnosis not present

## 2023-06-27 DIAGNOSIS — H5213 Myopia, bilateral: Secondary | ICD-10-CM | POA: Diagnosis not present

## 2023-06-27 DIAGNOSIS — H52209 Unspecified astigmatism, unspecified eye: Secondary | ICD-10-CM | POA: Diagnosis not present

## 2023-09-20 DIAGNOSIS — H6123 Impacted cerumen, bilateral: Secondary | ICD-10-CM | POA: Diagnosis not present

## 2023-09-23 DIAGNOSIS — R5383 Other fatigue: Secondary | ICD-10-CM | POA: Diagnosis not present

## 2023-09-23 DIAGNOSIS — K2100 Gastro-esophageal reflux disease with esophagitis, without bleeding: Secondary | ICD-10-CM | POA: Diagnosis not present

## 2023-09-23 DIAGNOSIS — M8580 Other specified disorders of bone density and structure, unspecified site: Secondary | ICD-10-CM | POA: Diagnosis not present

## 2023-09-23 DIAGNOSIS — L940 Localized scleroderma [morphea]: Secondary | ICD-10-CM | POA: Diagnosis not present

## 2023-09-23 DIAGNOSIS — Z79899 Other long term (current) drug therapy: Secondary | ICD-10-CM | POA: Diagnosis not present

## 2023-09-30 DIAGNOSIS — D692 Other nonthrombocytopenic purpura: Secondary | ICD-10-CM | POA: Diagnosis not present

## 2023-09-30 DIAGNOSIS — M8580 Other specified disorders of bone density and structure, unspecified site: Secondary | ICD-10-CM | POA: Diagnosis not present

## 2023-09-30 DIAGNOSIS — Z79899 Other long term (current) drug therapy: Secondary | ICD-10-CM | POA: Diagnosis not present

## 2023-09-30 DIAGNOSIS — K2100 Gastro-esophageal reflux disease with esophagitis, without bleeding: Secondary | ICD-10-CM | POA: Diagnosis not present

## 2023-09-30 DIAGNOSIS — L940 Localized scleroderma [morphea]: Secondary | ICD-10-CM | POA: Diagnosis not present

## 2023-10-17 DIAGNOSIS — D7218 Eosinophilia in diseases classified elsewhere: Secondary | ICD-10-CM | POA: Diagnosis not present

## 2023-10-17 DIAGNOSIS — M354 Diffuse (eosinophilic) fasciitis: Secondary | ICD-10-CM | POA: Diagnosis not present

## 2023-10-17 DIAGNOSIS — L940 Localized scleroderma [morphea]: Secondary | ICD-10-CM | POA: Diagnosis not present

## 2023-10-25 DIAGNOSIS — Z23 Encounter for immunization: Secondary | ICD-10-CM | POA: Diagnosis not present
